# Patient Record
Sex: Male | Born: 1980 | Race: White | Hispanic: No | Marital: Single | Smoking: Never smoker
Health system: Southern US, Community
[De-identification: ages and names within clinical notes are randomized; demographics above are authoritative.]

## PROBLEM LIST (undated history)

## (undated) DIAGNOSIS — N200 Calculus of kidney: Secondary | ICD-10-CM

## (undated) HISTORY — PX: LITHOTRIPSY: SUR834

---
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
## 2003-01-07 ENCOUNTER — Encounter: Admission: RE | Admit: 2003-01-07 | Discharge: 2003-01-07 | Payer: Self-pay | Admitting: Family Medicine

## 2003-01-07 ENCOUNTER — Emergency Department (HOSPITAL_COMMUNITY): Admission: EM | Admit: 2003-01-07 | Discharge: 2003-01-07 | Payer: Self-pay | Admitting: Emergency Medicine

## 2003-01-07 ENCOUNTER — Encounter: Payer: Self-pay | Admitting: Family Medicine

## 2003-01-13 ENCOUNTER — Ambulatory Visit (HOSPITAL_BASED_OUTPATIENT_CLINIC_OR_DEPARTMENT_OTHER): Admission: RE | Admit: 2003-01-13 | Discharge: 2003-01-13 | Payer: Self-pay | Admitting: Urology

## 2003-01-13 ENCOUNTER — Encounter: Payer: Self-pay | Admitting: Urology

## 2005-03-08 ENCOUNTER — Encounter: Admission: RE | Admit: 2005-03-08 | Discharge: 2005-03-08 | Payer: Self-pay | Admitting: Family Medicine

## 2007-01-13 ENCOUNTER — Emergency Department (HOSPITAL_COMMUNITY): Admission: EM | Admit: 2007-01-13 | Discharge: 2007-01-13 | Payer: Self-pay | Admitting: Family Medicine

## 2008-06-16 ENCOUNTER — Ambulatory Visit (HOSPITAL_COMMUNITY): Admission: RE | Admit: 2008-06-16 | Discharge: 2008-06-16 | Payer: Self-pay | Admitting: Urology

## 2008-07-30 ENCOUNTER — Emergency Department (HOSPITAL_COMMUNITY): Admission: EM | Admit: 2008-07-30 | Discharge: 2008-07-30 | Payer: Self-pay | Admitting: Emergency Medicine

## 2008-09-27 ENCOUNTER — Emergency Department (HOSPITAL_COMMUNITY): Admission: EM | Admit: 2008-09-27 | Discharge: 2008-09-27 | Payer: Self-pay | Admitting: Emergency Medicine

## 2010-08-23 ENCOUNTER — Encounter: Payer: Self-pay | Admitting: Internal Medicine

## 2010-08-23 ENCOUNTER — Ambulatory Visit: Payer: Self-pay | Admitting: Internal Medicine

## 2010-08-23 DIAGNOSIS — R5381 Other malaise: Secondary | ICD-10-CM | POA: Insufficient documentation

## 2010-08-23 DIAGNOSIS — G4700 Insomnia, unspecified: Secondary | ICD-10-CM | POA: Insufficient documentation

## 2010-08-23 DIAGNOSIS — R5383 Other fatigue: Secondary | ICD-10-CM

## 2010-08-23 DIAGNOSIS — E785 Hyperlipidemia, unspecified: Secondary | ICD-10-CM

## 2010-08-23 DIAGNOSIS — Z87442 Personal history of urinary calculi: Secondary | ICD-10-CM | POA: Insufficient documentation

## 2010-08-23 LAB — CONVERTED CEMR LAB
ALT: 66 units/L — ABNORMAL HIGH (ref 0–53)
AST: 28 units/L (ref 0–37)
Alkaline Phosphatase: 63 units/L (ref 39–117)
Basophils Absolute: 0 10*3/uL (ref 0.0–0.1)
CO2: 29 meq/L (ref 19–32)
Chloride: 103 meq/L (ref 96–112)
Cholesterol: 211 mg/dL — ABNORMAL HIGH (ref 0–200)
GFR calc non Af Amer: 92.8 mL/min (ref >60)
Glucose, Bld: 90 mg/dL (ref 70–99)
HCT: 48.5 % (ref 39.0–52.0)
HDL: 38.5 mg/dL — ABNORMAL LOW (ref >39.00)
Hemoglobin, Urine: NEGATIVE
LDL Goal: 160 mg/dL
Leukocytes, UA: NEGATIVE
Lymphs Abs: 2.6 10*3/uL (ref 0.7–4.0)
MCHC: 34.8 g/dL (ref 30.0–36.0)
MCV: 88.6 fL (ref 78.0–100.0)
Neutrophils Relative %: 49.5 % (ref 43.0–77.0)
Platelets: 309 10*3/uL (ref 150.0–400.0)
Potassium: 5 meq/L (ref 3.5–5.1)
Sodium: 139 meq/L (ref 135–145)
Total Bilirubin: 0.9 mg/dL (ref 0.3–1.2)
Urine Glucose: NEGATIVE mg/dL
Urobilinogen, UA: 0.2 (ref 0.0–1.0)
VLDL: 38 mg/dL (ref 0.0–40.0)

## 2010-08-24 ENCOUNTER — Encounter: Payer: Self-pay | Admitting: Internal Medicine

## 2010-10-25 ENCOUNTER — Ambulatory Visit: Payer: Self-pay | Admitting: Internal Medicine

## 2010-11-08 ENCOUNTER — Emergency Department (HOSPITAL_COMMUNITY)
Admission: EM | Admit: 2010-11-08 | Discharge: 2010-11-08 | Payer: Self-pay | Source: Home / Self Care | Admitting: Emergency Medicine

## 2010-12-09 ENCOUNTER — Encounter: Payer: Self-pay | Admitting: Family Medicine

## 2010-12-18 NOTE — Letter (Signed)
Summary: Results Follow-up Letter  [facility]  [address] [place], [place] [address]   Phone: [phone]  Fax: [phone]    08/24/2010  [address] [name], [place]  [address]  Dear [name],   The following are the results of your recent test(s):  Test     Result     Liver enzymes   one was very slightly elevated Kidney     normal CBC       normal Thyroid     normal Urine       normal STD tests     negative   _________________________________________________________  Please call for an appointment in 1-2 months to recheck liver enzymes _________________________________________________________ _________________________________________________________ _________________________________________________________  Sincerely,  Sanda Linger MD [facility]

## 2010-12-18 NOTE — Letter (Signed)
Summary: Lipid Letter  [facility]  [address] [place], [place] [address]   Phone: [phone]  Fax: [phone]    08/23/2010  Raiford Fetterman [address] [place], [place]  [address]  Dear Tamera Reason:  We have carefully reviewed your last lipid profile from  and the results are noted below with a summary of recommendations for lipid management.    Cholesterol:       211     Goal: <200   HDL "good" Cholesterol:   41.32     Goal: >40   LDL "bad" Cholesterol:   140     Goal: <160   Triglycerides:       190.0     Goal: <150        TLC Diet (Therapeutic Lifestyle Change): Saturated Fats & Transfatty acids should be kept < 7% of total calories ***Reduce Saturated Fats Polyunstaurated Fat can be up to 10% of total calories Monounsaturated Fat Fat can be up to 20% of total calories Total Fat should be no greater than 25-35% of total calories Carbohydrates should be 50-60% of total calories Protein should be approximately 15% of total calories Fiber should be at least 20-30 grams a day ***Increased fiber may help lower LDL Total Cholesterol should be < 200mg /day Consider adding plant stanol/sterols to diet (example: Benacol spread) ***A higher intake of unsaturated fat may reduce Triglycerides and Increase HDL    Adjunctive Measures (may lower LIPIDS and reduce risk of Heart Attack) include: Aerobic Exercise (20-30 minutes 3-4 times a week) Limit Alcohol Consumption Weight Reduction Aspirin 75-81 mg a day by mouth (if not allergic or contraindicated) Dietary Fiber 20-30 grams a day by mouth     Current Medications: 1)    Zolpidem Tartrate 10 Mg Tabs (Zolpidem tartrate) .... One by mouth at bedtime as needed for insomnia  If you have any questions, please call. We appreciate being able to work with you.   Sincerely,    [facility] [name] MD

## 2010-12-18 NOTE — Assessment & Plan Note (Signed)
Summary: New Cpx    Vital Signs:  Patient profile:   30 year old male Height:      68 inches Weight:      181 pounds BMI:     27.62 O2 Sat:      98 % on Room air Temp:     97.8 degrees F oral Pulse rate:   69 / minute Pulse rhythm:   regular Resp:     16 per minute BP sitting:   118 / 88  (left arm) Cuff size:   large  Vitals Entered By: Adam Drake CMA (August 23, 2010 10:16 AM)  Nutrition Counseling: Patient's BMI is greater than 25 and therefore counseled on weight management options.  O2 Flow:  Room air CC: New pt CPX w/ labs, Insomnia, Fatigue, Lipid Management Is Patient Diabetic? No Pain Assessment Patient in pain? no       Does patient need assistance? Functional Status Self care Ambulation Normal   Primary Care Adam Drake:  Adam Grandchild MD  CC:  New pt CPX w/ labs, Insomnia, Fatigue, and Lipid Management.  History of Present Illness:  Insomnia      [name] who presents with Insomnia.  The symptoms began 4-6 months ago.  The severity is described as mild.  The patient reports difficulty falling asleep, frequent awakening, and daytime somnolence, but denies early awakening, nightmares, leg movements, snoring, and apnea noted by partner.  Associated symptoms include weight gain.  Risk factors for insomnia include irregular work schedule.    Fatigue      The patient also presents with Fatigue.  The symptoms began 4-8 weeks ago.  The severity is described as mild.  The patient reports persistent fatigue and primarily motivational fatigue, but denies fatigue with vigorous exertion, fatigue with moderate exertion, fatigue with minimal exertion, fatigue without physical limitations, and primarily physical fatigue.  The patient denies fever, night sweats, weight loss, exertional chest pain, dyspnea, cough, hemoptysis, and new medications.  Other symptoms include daytime sleepiness.  The patient denies the following symptoms: leg swelling, orthopnea,  PND, melena, adenopathy, severe snoring, and skin changes.  Depressive symptoms include anhedonia and poor sleep.  The patient denies feeling depressed and altered appetite.    Also, he wants to be screened for STD's. He was monogamous with one girlfriend for 5 years and he has not had any signs or symptoms but he wants to be checked. Neither he nor his GF have ever had an STD.  Lipid Management History:      Negative NCEP/ATP III risk factors include male age less than 67 years old, non-diabetic, no family history for ischemic heart disease, non-tobacco-user status, non-hypertensive, no ASHD (atherosclerotic heart disease), no prior stroke/TIA, no peripheral vascular disease, and no history of aortic aneurysm.        The patient states that he knows about the "Therapeutic Lifestyle Change" diet.  His compliance with the TLC diet is good.  Adjunctive measures started by the patient include aerobic exercise, fiber, limit alcohol consumpton, and weight reduction.     Preventive Screening-Counseling & Management  Alcohol-Tobacco     Alcohol drinks/day: <1     Alcohol type: all     >5/day in last 3 mos: no     Alcohol Counseling: not indicated; use of alcohol is not excessive or problematic     Feels need to cut down: no     Feels annoyed by complaints: no  Feels guilty re: drinking: no     Needs 'eye opener' in am: no     Smoking Status: never     Tobacco Counseling: not indicated; no tobacco use  Caffeine-Diet-Exercise     Does Patient Exercise: yes  Hep-HIV-STD-Contraception     Hepatitis Risk: no risk noted     HIV Risk: no risk noted     STD Risk: no risk noted     Dental Visit-last 6 months yes     Dental Care Counseling: to seek dental care; no dental care within six months     TSE monthly: yes     Testicular SE Education/Counseling to perform regular STE     Sun Exposure-Excessive: no  Safety-Violence-Falls     Seat Belt Use: yes     Helmet Use: yes     Firearms in the  Home: firearms in the home     Firearm Counseling: to practice firearm safety     Smoke Detectors: yes     Violence in the Home: no risk noted     Sexual Abuse: no      Sexual History:  currently monogamous.        Drug Use:  never and no.        Blood Transfusions:  no.    Current Medications (verified): 1)  None  Allergies (verified): 1)  ! Codeine  Past History:  Past Medical History: Hyperlipidemia Nephrolithiasis, hx of  Past Surgical History: Denies surgical history  Family History: Family History High cholesterol Family History Hypertension Family History Diabetes Family History Breast Cancer  Social History: Occupation: Emergency planning/management officer Single Never Smoked Alcohol use-yes Drug use-no Regular exercise-yes Smoking Status:  never Drug Use:  never, no Does Patient Exercise:  yes Education:  Environmental manager Use:  yes Hepatitis Risk:  no risk noted HIV Risk:  no risk noted STD Risk:  no risk noted Dental Care w/in 6 mos.:  yes Sun Exposure-Excessive:  no Sexual History:  currently monogamous Blood Transfusions:  no  Review of Systems       The patient complains of weight gain.  The patient denies anorexia, fever, weight loss, chest pain, syncope, dyspnea on exertion, peripheral edema, prolonged cough, headaches, hemoptysis, abdominal pain, hematochezia, severe indigestion/heartburn, hematuria, suspicious skin lesions, difficulty walking, depression, abnormal bleeding, enlarged lymph nodes, and testicular masses.   General:  Complains of fatigue, malaise, and sleep disorder; denies chills, fever, loss of appetite, sweats, weakness, and weight loss. Resp:  Complains of excessive snoring; denies chest discomfort, chest pain with inspiration, cough, coughing up blood, hypersomnolence, morning headaches, pleuritic, shortness of breath, sputum productive, and wheezing. GU:  Denies decreased libido, discharge, dysuria, erectile dysfunction, genital sores,  hematuria, urinary frequency, and urinary hesitancy.  Physical Exam  General:  alert, well-developed, well-nourished, well-hydrated, appropriate dress, normal appearance, healthy-appearing, cooperative to examination, and good hygiene.   Head:  normocephalic, atraumatic, no abnormalities observed, no abnormalities palpated, and no alopecia.   Eyes:  vision grossly intact, pupils equal, pupils round, and pupils reactive to light.   Ears:  R ear normal and L ear normal.   Mouth:  Oral mucosa and oropharynx without lesions or exudates.  Teeth in good repair. Neck:  supple, full ROM, no masses, no thyromegaly, no thyroid nodules or tenderness, no JVD, normal carotid upstroke, no carotid bruits, no cervical lymphadenopathy, and no neck tenderness.   Breasts:  No masses or gynecomastia noted Lungs:  Normal respiratory effort, chest expands symmetrically. Lungs are  clear to auscultation, no crackles or wheezes. Heart:  Normal rate and regular rhythm. S1 and S2 normal without gallop, murmur, click, rub or other extra sounds. Abdomen:  Bowel sounds positive,abdomen soft and non-tender without masses, organomegaly or hernias noted. Genitalia:  circumcised, no hydrocele, no varicocele, no scrotal masses, no testicular masses or atrophy, no cutaneous lesions, and no urethral discharge.   Msk:  No deformity or scoliosis noted of thoracic or lumbar spine.   Pulses:  R and L carotid,radial,femoral,dorsalis pedis and posterior tibial pulses are full and equal bilaterally Extremities:  No clubbing, cyanosis, edema, or deformity noted with normal full range of motion of all joints.   Neurologic:  No cranial nerve deficits noted. Station and gait are normal. Plantar reflexes are down-going bilaterally. DTRs are symmetrical throughout. Sensory, motor and coordinative functions appear intact. Skin:  Intact without suspicious lesions or rashes Cervical Nodes:  no anterior cervical adenopathy and no posterior cervical  adenopathy.   Axillary Nodes:  no R axillary adenopathy and no L axillary adenopathy.   Inguinal Nodes:  no R inguinal adenopathy and no L inguinal adenopathy.   Psych:  Cognition and judgment appear intact. Alert and cooperative with normal attention span and concentration. No apparent delusions, illusions, hallucinations   Impression & Recommendations:  Problem # 1:  FATIGUE (ICD-780.79) Assessment New I think this is caused by his irregular sleep pattern, I will treat his sleep disorder and look for an improvement in his symptoms. If he does not improve and snoring and other symptoms persist that I will talk to him about a sleep study. Orders: Venipuncture ([address]) TLB-Lipid Panel (80061-LIPID) TLB-BMP (Basic Metabolic Panel-BMET) (80048-METABOL) TLB-CBC Platelet - w/Differential (85025-CBCD) TLB-Hepatic/Liver Function Pnl (80076-HEPATIC) TLB-TSH (Thyroid Stimulating Hormone) (84443-TSH) EKG w/ Interpretation ([address])  Problem # 2:  INSOMNIA-SLEEP DISORDER-UNSPEC (ICD-780.52) Assessment: New as above His updated medication list for this problem includes:    Zolpidem Tartrate 10 Mg Tabs (Zolpidem tartrate) ..... One by mouth at bedtime as needed for insomnia  Problem # 3:  ROUTINE GENERAL MEDICAL EXAM@HEALTH  CARE FACL (ICD-V70.0) Assessment: New  Orders: TLB-Udip w/ Micro (81001-URINE) T-Chlamydia  Probe, urine [id number]) T-GC Probe, urine (91478-29562) EKG w/ Interpretation (93000)  Td Booster: Historical (11/18/2006)   Flu Vax: Historical (11/19/2007)   Pneumovax: Historical (11/18/2006)  Discussed using sunscreen, use of alcohol, drug use, self testicular exam, routine dental care, routine eye care, routine physical exam, seat belts, multiple vitamins, and recommendations for immunizations.  Discussed exercise and checking cholesterol.  Discussed gun safety and safe sex.  Problem # 4:  HYPERLIPIDEMIA (ICD-272.4) Assessment: Unchanged  Lipid Goals: Chol Goal: 200  (08/23/2010)   HDL Goal: 40 (08/23/2010)   LDL Goal: 160 (08/23/2010)   TG Goal: 150 (08/23/2010)  10 Yr Risk Heart Disease: Not enough information  Complete Medication List: 1)  Zolpidem Tartrate 10 Mg Tabs (Zolpidem tartrate) .... One by mouth at bedtime as needed for insomnia  Lipid Assessment/Plan:      Based on NCEP/ATP III, the patient's risk factor category is "0-1 risk factors".  The patient's lipid goals are as follows: Total cholesterol goal is 200; LDL cholesterol goal is 160; HDL cholesterol goal is 40; Triglyceride goal is 150.    Patient Instructions: 1)  Please schedule a follow-up appointment in 2 months. 2)  It is important that you exercise regularly at least 20 minutes 5 times a week. If you develop chest pain, have severe difficulty breathing, or feel very tired , stop exercising immediately and seek  medical attention. 3)  You need to lose weight. Consider a lower calorie diet and regular exercise.  Prescriptions: ZOLPIDEM TARTRATE 10 MG TABS (ZOLPIDEM TARTRATE) One by mouth at bedtime as needed for insomnia  #20 x 3   Entered and Authorized by:   Adam Grandchild MD   Signed by:   Adam Grandchild MD on 08/23/2010   Method used:   Print then Give to Patient   RxID:   [id number]   Preventive Care Screening  Last Tetanus Booster:    Date:  11/18/2006    Results:  Historical     Immunization History:  Influenza Immunization History:    Influenza:  historical (11/19/2007)  Pneumovax Immunization History:    Pneumovax:  historical (11/18/2006)

## 2011-01-28 LAB — URINE CULTURE
Colony Count: NO GROWTH
Culture  Setup Time: 201112230114

## 2011-01-28 LAB — URINE MICROSCOPIC-ADD ON

## 2011-01-28 LAB — URINALYSIS, ROUTINE W REFLEX MICROSCOPIC
Glucose, UA: NEGATIVE mg/dL
Ketones, ur: NEGATIVE mg/dL
Leukocytes, UA: NEGATIVE
Protein, ur: 30 mg/dL — AB

## 2011-06-29 IMAGING — CT CT ABD-PELV W/O CM
1 of 2 series · 15 of 32 positions shown, 19 images · non-contrast
Comparison: 09/27/2008

CLINICAL DATA: Right flank pain.  History of kidney stones

CT ABDOMEN AND PELVIS WITHOUT CONTRAST
TECHNIQUE: Multidetector CT imaging of the abdomen and pelvis was
performed following the standard protocol without intravenous
contrast.

[Series 2: under 200# stone no prev · axial · 0.63mm/px · z∈[-490,-50]mm · 15 of 98 slices shown, 19 images]
[im 5/98  soft-tissue]
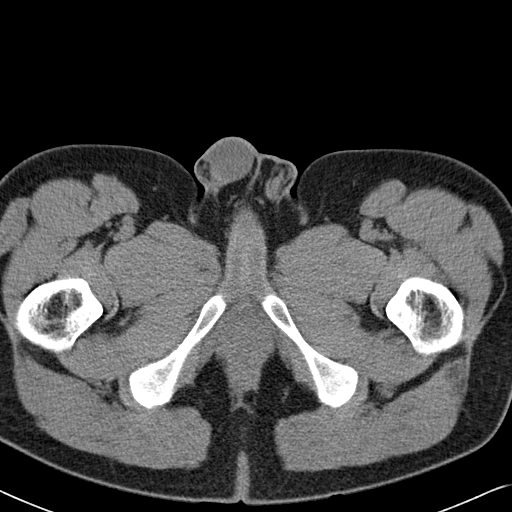
[im 5/98  bone]
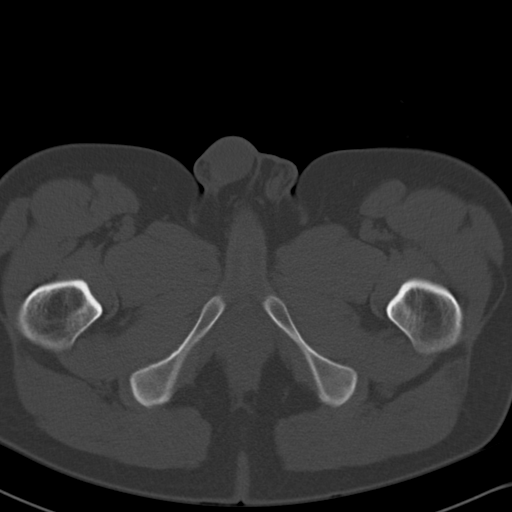
[im 13/98  soft-tissue]
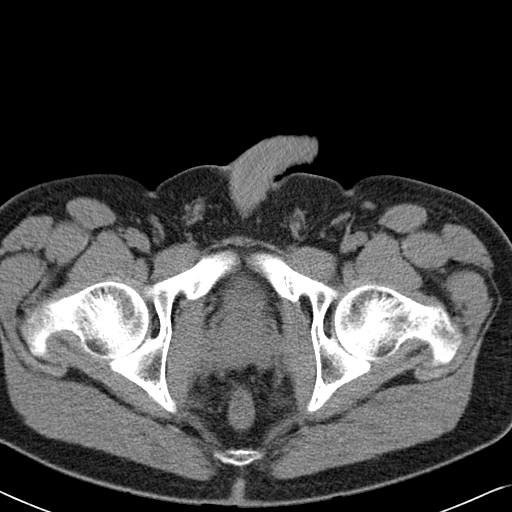
[im 21/98  soft-tissue]
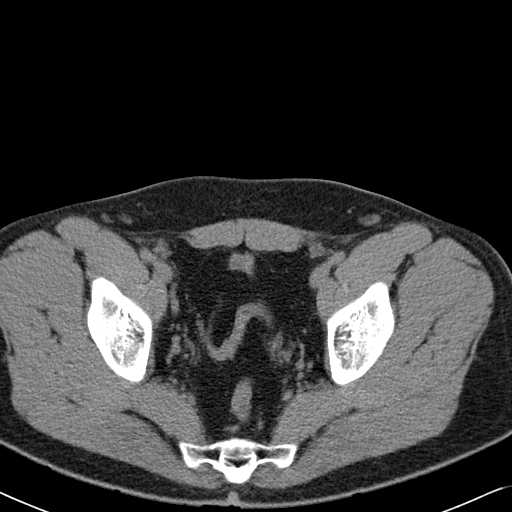
[im 29/98  soft-tissue]
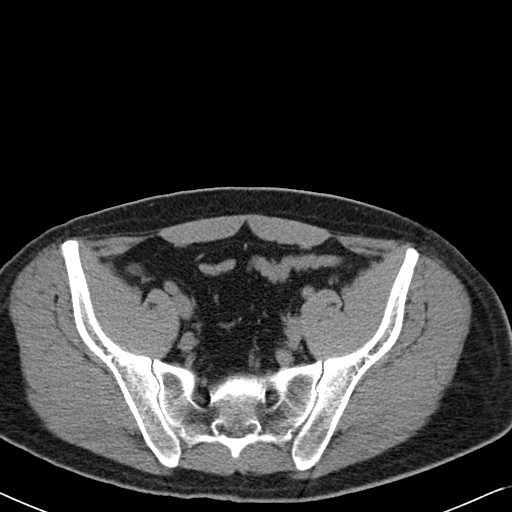
[im 33/98  soft-tissue]
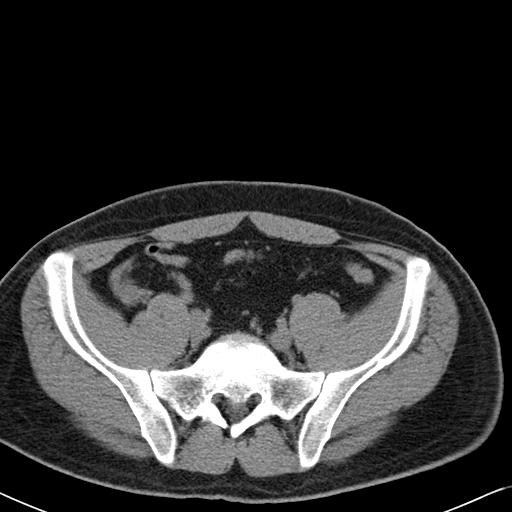
[im 41/98  soft-tissue]
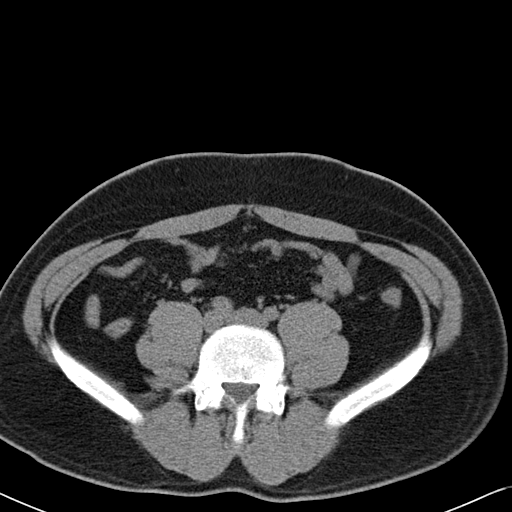
[im 49/98  soft-tissue]
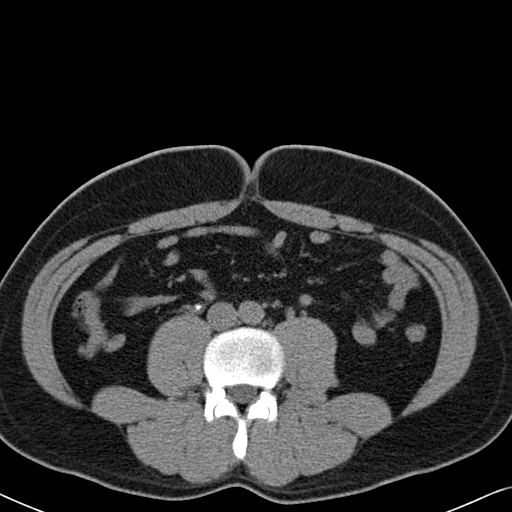
[im 57/98  soft-tissue]
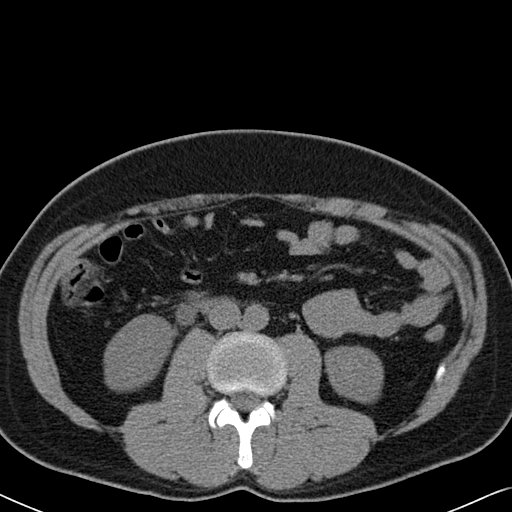
[im 65/98  soft-tissue]
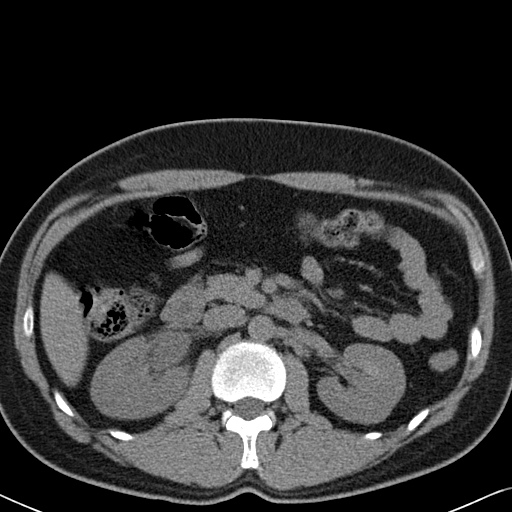
[im 65/98  bone]
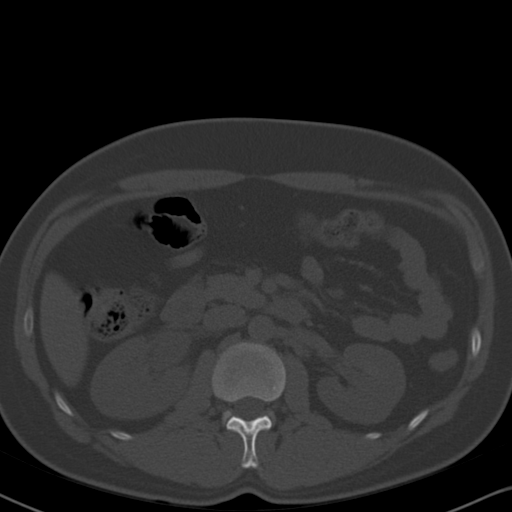
[im 69/98  soft-tissue]
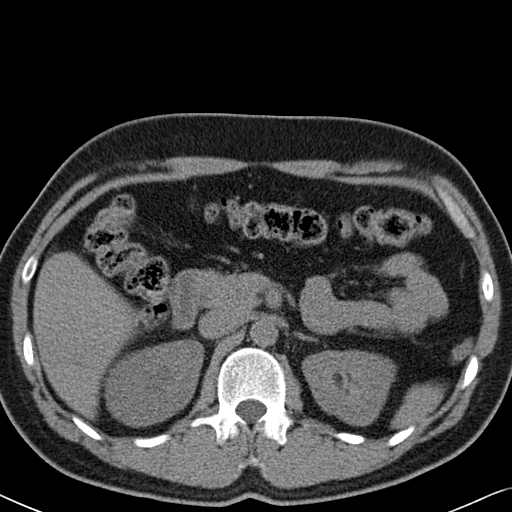
[im 77/98  soft-tissue]
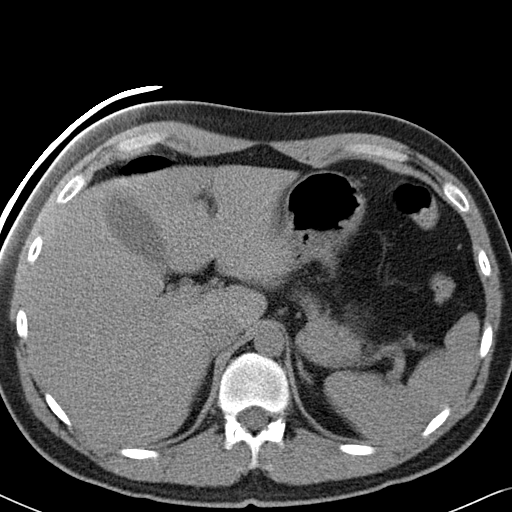
[im 81/98  lung]
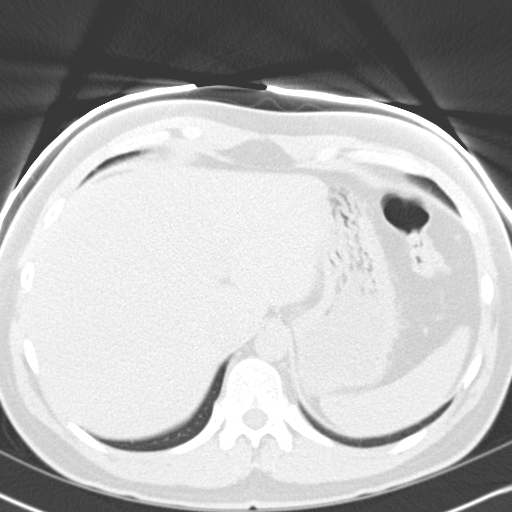
[im 85/98  soft-tissue]
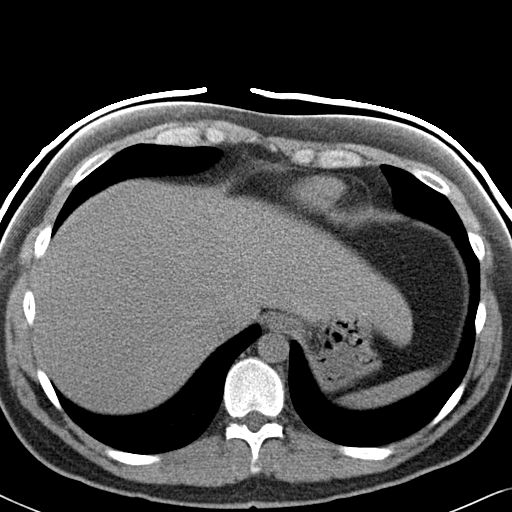
[im 85/98  lung]
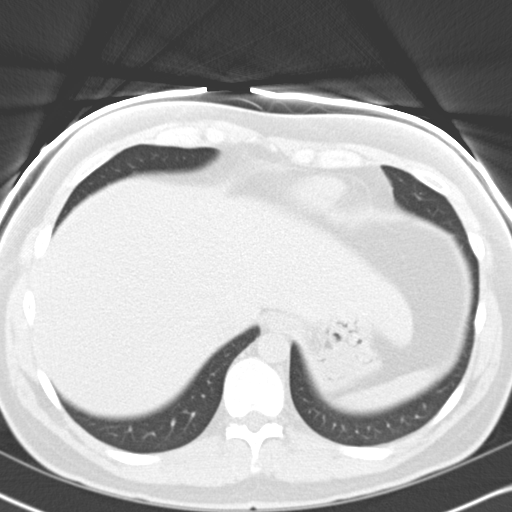
[im 89/98  lung]
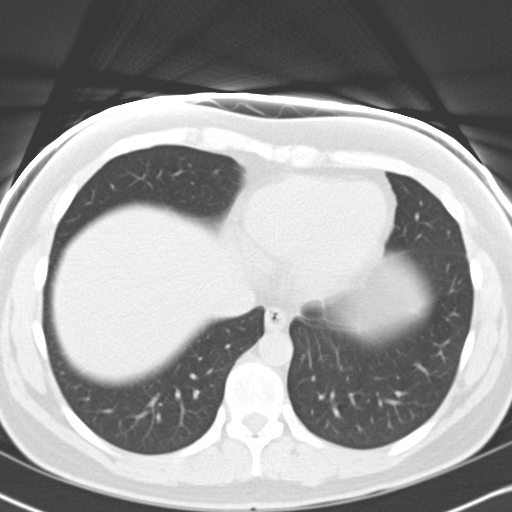
[im 93/98  soft-tissue]
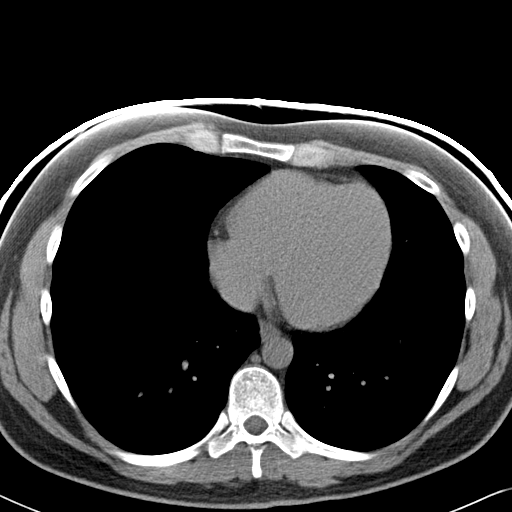
[im 93/98  lung]
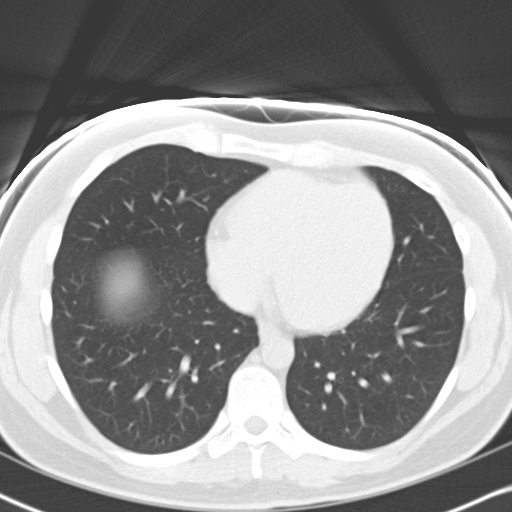

[15 of 32 positions shown; findings below may reference images not displayed]

FINDINGS: The lung bases are clear.  There is  a small
nonobstructing calculus in the lower pole of the left kidney.
There is, however, significant right hydronephrosis and
hydroureter.  On images number 50 and 51 of series 2, there is a 4
x 4 mm calculus.  In the mid ureter, at about the level of the like
this.  It measures 6.6 mm in the CC plane.  No other ureteral or
bladder calculi.

Other abdominal viscera normal in the unenhanced state.  No
calcified gallstones.
IMPRESSION: 1.  4 x 4 x 6.6 mm right mid ureteral calculus with moderate
obstructive uropathy.
2.  There is a small nonobstructing calculus in the lower pole of
the left kidney.
3.  No other acute or significant findings in the unenhanced state.

## 2011-08-16 LAB — CBC
HCT: 47.1
MCV: 88.6
RBC: 5.31
WBC: 8.2

## 2011-08-16 LAB — URINALYSIS, ROUTINE W REFLEX MICROSCOPIC
Bilirubin Urine: NEGATIVE
Glucose, UA: NEGATIVE
Hgb urine dipstick: NEGATIVE
Ketones, ur: NEGATIVE
Protein, ur: NEGATIVE

## 2011-08-16 LAB — URINE MICROSCOPIC-ADD ON

## 2011-08-16 LAB — BASIC METABOLIC PANEL
Chloride: 109
GFR calc Af Amer: >60
Potassium: 4

## 2011-08-20 LAB — URINALYSIS, ROUTINE W REFLEX MICROSCOPIC
Bilirubin Urine: NEGATIVE
Glucose, UA: NEGATIVE
Ketones, ur: NEGATIVE
Leukocytes, UA: NEGATIVE
pH: 6.5

## 2011-08-20 LAB — URINE MICROSCOPIC-ADD ON

## 2011-08-21 LAB — URINALYSIS, ROUTINE W REFLEX MICROSCOPIC
Nitrite: NEGATIVE
Protein, ur: NEGATIVE
Specific Gravity, Urine: >1.03 — ABNORMAL HIGH
Urobilinogen, UA: 0.2

## 2011-08-21 LAB — URINE MICROSCOPIC-ADD ON

## 2013-12-08 ENCOUNTER — Encounter (HOSPITAL_COMMUNITY): Payer: Self-pay | Admitting: Emergency Medicine

## 2013-12-08 ENCOUNTER — Emergency Department (HOSPITAL_COMMUNITY): Payer: BC Managed Care – PPO

## 2013-12-08 ENCOUNTER — Emergency Department (HOSPITAL_COMMUNITY)
Admission: EM | Admit: 2013-12-08 | Discharge: 2013-12-08 | Disposition: A | Discharge status: Home / Self Care | Payer: BC Managed Care – PPO | Attending: Emergency Medicine | Admitting: Emergency Medicine

## 2013-12-08 DIAGNOSIS — R109 Unspecified abdominal pain: Secondary | ICD-10-CM | POA: Insufficient documentation

## 2013-12-08 DIAGNOSIS — Z87442 Personal history of urinary calculi: Secondary | ICD-10-CM | POA: Insufficient documentation

## 2013-12-08 DIAGNOSIS — R110 Nausea: Secondary | ICD-10-CM | POA: Insufficient documentation

## 2013-12-08 HISTORY — DX: Calculus of kidney: N20.0

## 2013-12-08 LAB — URINALYSIS, ROUTINE W REFLEX MICROSCOPIC
Bilirubin Urine: NEGATIVE
Glucose, UA: NEGATIVE mg/dL
Hgb urine dipstick: NEGATIVE
Ketones, ur: NEGATIVE mg/dL
LEUKOCYTES UA: NEGATIVE
Nitrite: NEGATIVE
PROTEIN: NEGATIVE mg/dL
UROBILINOGEN UA: 0.2 mg/dL (ref 0.0–1.0)
pH: 6 (ref 5.0–8.0)

## 2013-12-08 MED ORDER — KETOROLAC TROMETHAMINE 30 MG/ML IJ SOLN
30.0000 mg | Freq: Once | INTRAMUSCULAR | Status: AC
  Administered 2013-12-08: 30 mg via INTRAMUSCULAR
  Filled 2013-12-08: qty 1

## 2013-12-08 MED ORDER — TAMSULOSIN HCL 0.4 MG PO CAPS: 0.4000 mg | ORAL_CAPSULE | Freq: Every day | ORAL | Status: DC

## 2013-12-08 NOTE — ED Notes (Signed)
Patient with no complaints at this time. Respirations even and unlabored. Skin warm/dry. Discharge instructions reviewed with patient at this time. Patient given opportunity to voice concerns/ask questions. Patient discharged at this time and left Emergency Department with steady gait.   

## 2013-12-08 NOTE — ED Notes (Signed)
Pt c/o left flank pain/n/cloudy urine since yesterday.

## 2013-12-08 NOTE — ED Provider Notes (Signed)
CSN: [id number]     Arrival date & time 12/08/13  4098 History  This chart was scribed for Gerhard Munch, MD by Danella Maiers, ED Scribe. This patient was seen in room APA03/APA03 and the patient's care was started at 9:05 AM.    Chief Complaint  Patient presents with  . Flank Pain   The history is provided by the patient. No language interpreter was used.   HPI Comments: Adam Drake is a 33 y.o. male with a h/o kidney stones who presents to the Emergency Department complaining of left flank pain onset yesterday that has gradually worsened since this morning. Pt reports he gets kidney stones every 12-18 months. He reports cold symptoms recently. He also reports nausea. He denies any urinary symptoms. He has not taken anything for the pain. 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   Past Medical History  Diagnosis Date  . Kidney stones    Past Surgical History  Procedure Laterality Date  . Lithotripsy     No family history on file. History  Substance Use Topics  . Smoking status: Never Smoker   . Smokeless tobacco: Not on file  . Alcohol Use: Yes     Comment: occasional    Review of Systems  Constitutional:       Per HPI, otherwise negative  HENT:       Per HPI, otherwise negative  Respiratory:       Per HPI, otherwise negative  Cardiovascular:       Per HPI, otherwise negative  Gastrointestinal: Negative for vomiting.  Endocrine:       Negative aside from HPI  Genitourinary:       Neg aside from HPI   Musculoskeletal:       Per HPI, otherwise negative  Skin: Negative.   Neurological: Negative for syncope.    Allergies  Codeine  Home Medications  No current outpatient prescriptions on file. BP 144/92  Pulse 75  Temp(Src) 98.1 F (36.7 C)  Resp 20  Ht 5\' 8"  (1.727 m)  Wt 165 lb (74.844 kg)  BMI 25.09 kg/m2  SpO2 100% Physical Exam  Nursing note and vitals reviewed. Constitutional: He is oriented to person, place, and time. He appears well-developed. No  distress.  HENT:  Head: Normocephalic and atraumatic.  Eyes: Conjunctivae and EOM are normal.  Cardiovascular: Normal rate and regular rhythm.   Pulmonary/Chest: Effort normal. No stridor. No respiratory distress.  Abdominal: He exhibits no distension.  Musculoskeletal: He exhibits no edema.  Neurological: He is alert and oriented to person, place, and time.  Skin: Skin is warm and dry.  Psychiatric: He has a normal mood and affect.    ED Course  Procedures (including critical care time) Medications  ketorolac (TORADOL) 30 MG/ML injection 30 mg (30 mg Intramuscular Given 12/08/13 1002)    DIAGNOSTIC STUDIES: Oxygen Saturation is 100% on RA, normal by my interpretation.    COORDINATION OF CARE: 9:40 AM- Discussed treatment plan with pt which includes [place] and toradol. Pt agrees to plan.    Labs Review Labs Reviewed  URINALYSIS, ROUTINE W REFLEX MICROSCOPIC - Abnormal; Notable for the following:    Specific Gravity, Urine >1.030 (*)    All other components within normal limits   Imaging Review US Renal  12/08/2013   CLINICAL DATA:  Flank pain  EXAM: RETROPERITONEAL ULTRASOUND  COMPARISON:  CT abdomen and pelvis November 08, 2010  FINDINGS: The right kidney measures 10.8 cm in length. Left kidney measures 11.2 cm  in length. There is no renal mass, pelvicaliectasis, or perinephric fluid collection. There is no sonographically demonstrable calculus or ureterectasis on either side. Renal cortical thickness and echogenicity are normal bilaterally.  No lesion is seen by ultrasound in the region of the urinary bladder. There is calcification in the mid prostate region.  IMPRESSION: There is a prostatic calculus present. Study otherwise unremarkable. In particular, no obstructing focus is identified in either kidney.   Electronically Signed   By: Bretta BangWilliam  Woodruff M.D.   On: 12/08/2013 10:29    EKG Interpretation   None       MDM   1. Flank pain     I personally performed the  services described in this documentation, which was scribed in my presence. The recorded information has been reviewed and is accurate.  This generally well-appearing male presents with flank pain.  Eventually the patient also describes pain associated with likely URI.  Patient's evaluation was largely reassuring, and after a lengthy conversation with him about the findings, including prostate calculi, he was discharged in stable condition to follow up with urology, primary care   Gerhard Munchobert Jalal Rauch, MD 12/08/13 1538

## 2013-12-08 NOTE — Discharge Instructions (Signed)
As discussed, your evaluation here is largely reassuring.  There is some evidence of a prostate stone.  Please discuss this with your urologist.  In terms of your respiratory infection, please use: Sudafed, twice daily Triamcinolone nasal spray, 2 sprays, once daily for 5 days. Mucinex DM, as directed.  It is important that you followup with your primary care physician as well.  Return here for concerning changes in your condition.

## 2014-07-29 IMAGING — US US RENAL
1 series · 14 of 25 positions shown · non-contrast
Comparison: CT abdomen and pelvis November 08, 2010

CLINICAL DATA: Flank pain

EXAM:
RETROPERITONEAL ULTRASOUND

[Series 1: us renal · 0.22mm/px · 14 of 37 slices shown]
[im 1/37]
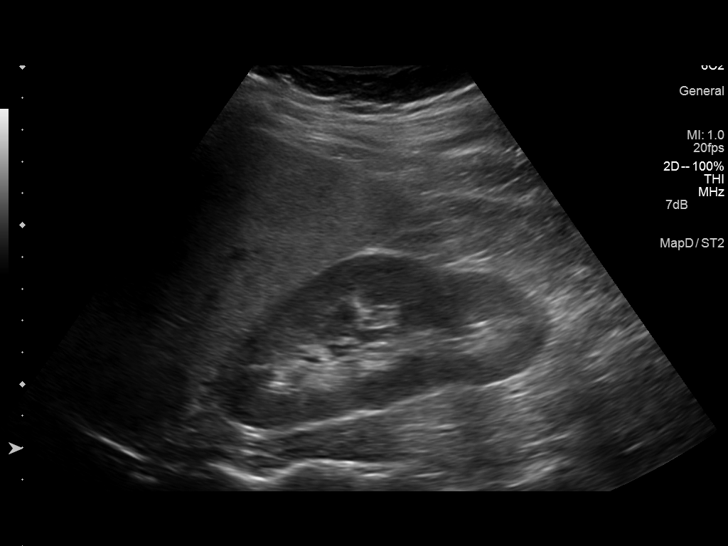
[im 4/37]
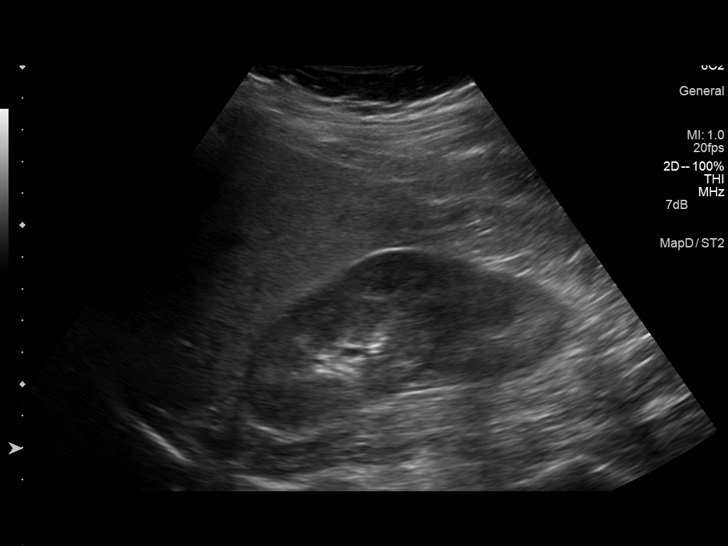
[im 7/37]
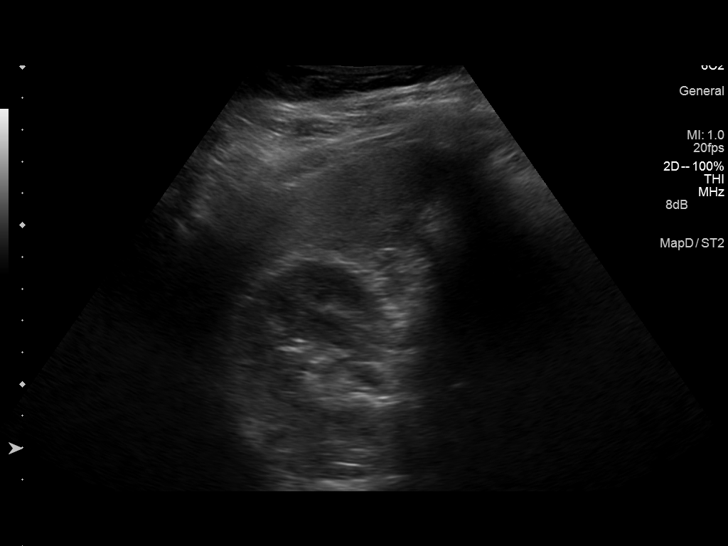
[im 10/37]
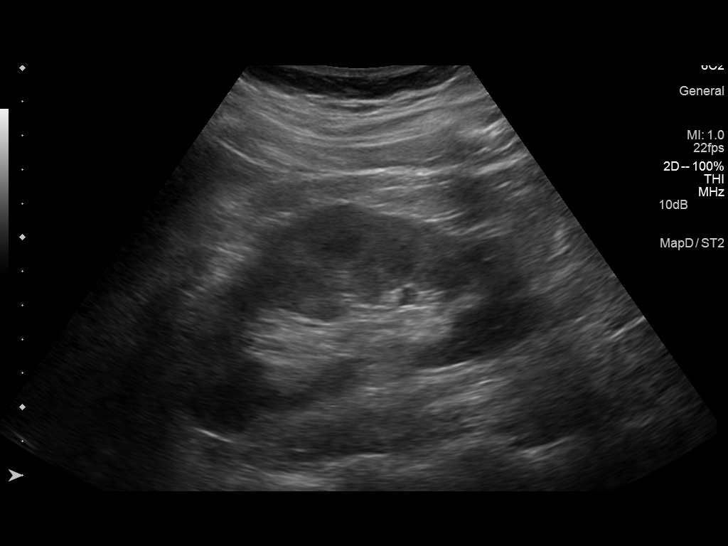
[im 13/37]
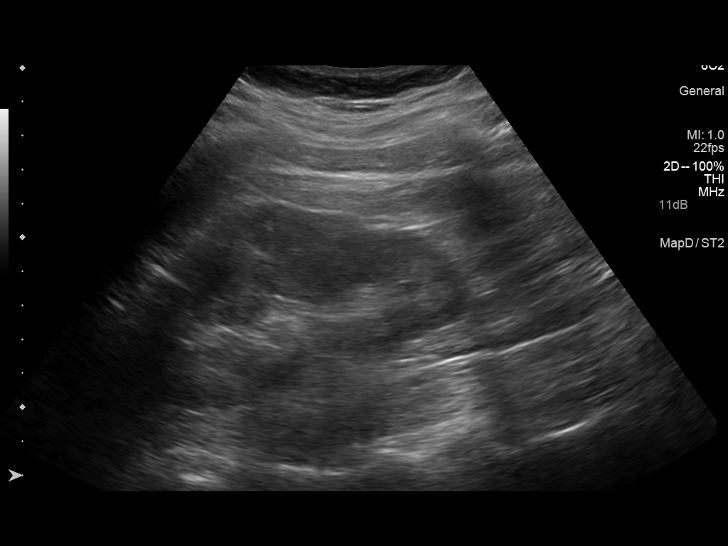
[im 14/37]
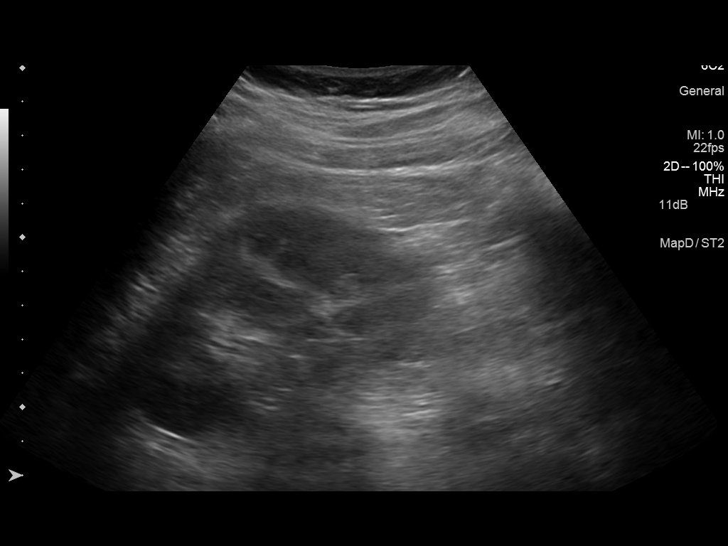
[im 17/37]
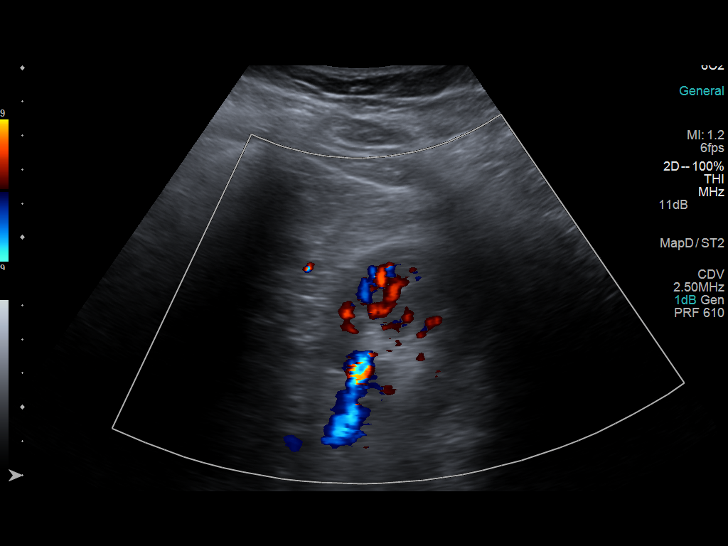
[im 20/37]
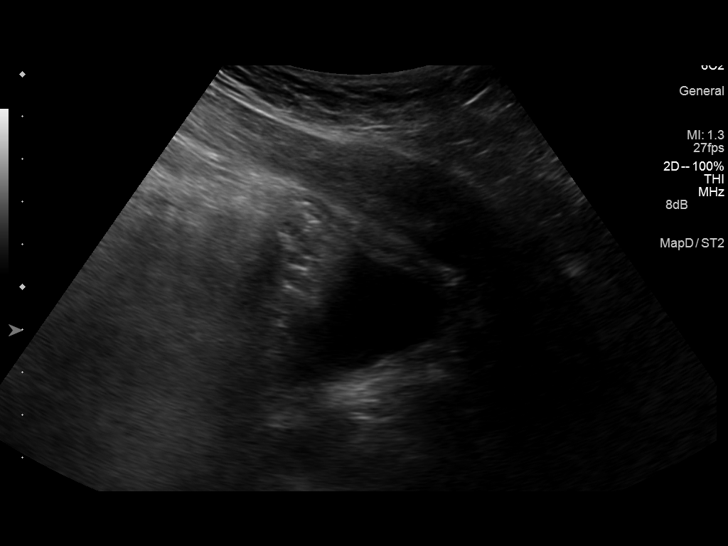
[im 23/37]
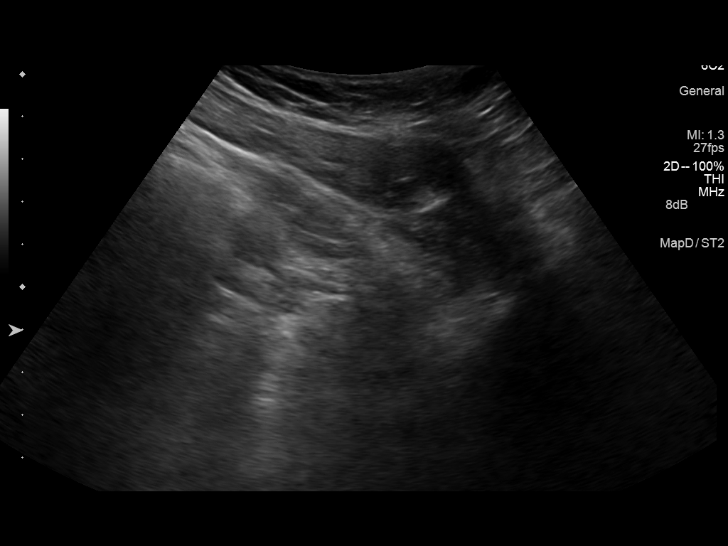
[im 25/37]
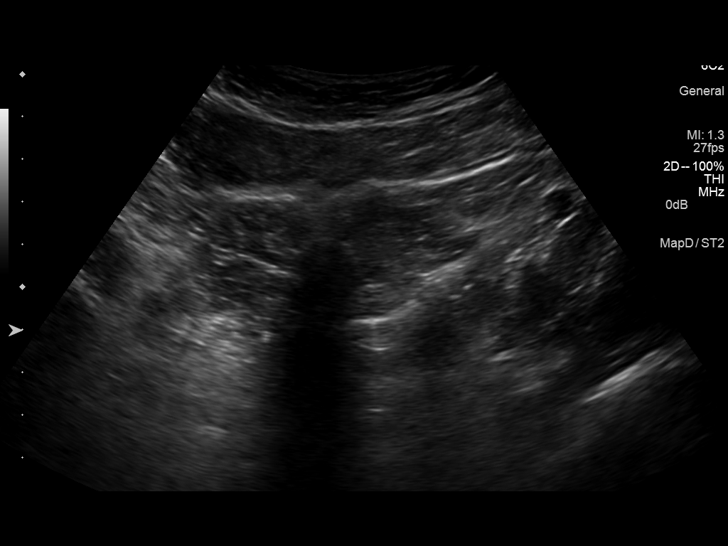
[im 28/37]
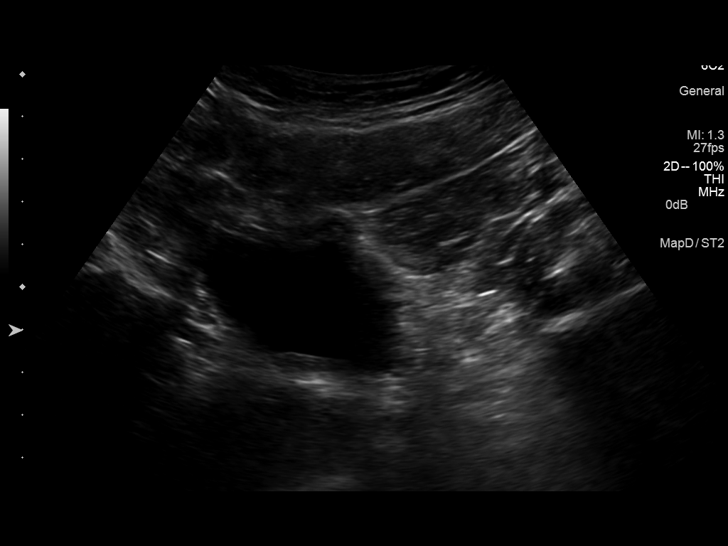
[im 31/37]
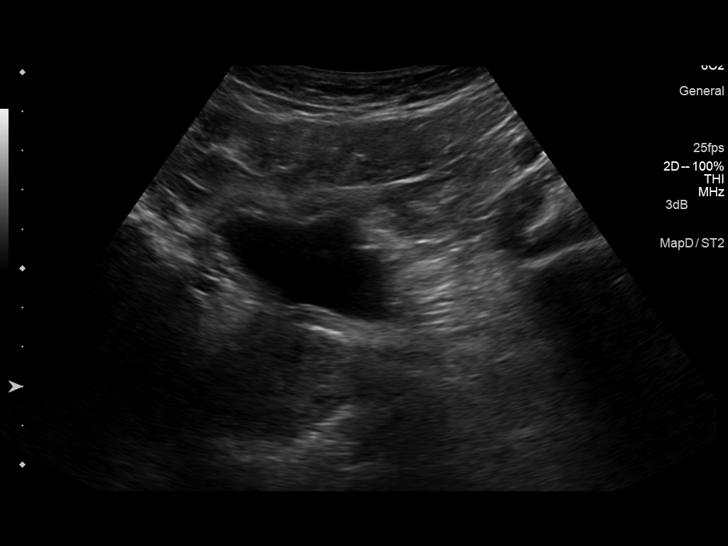
[im 34/37]
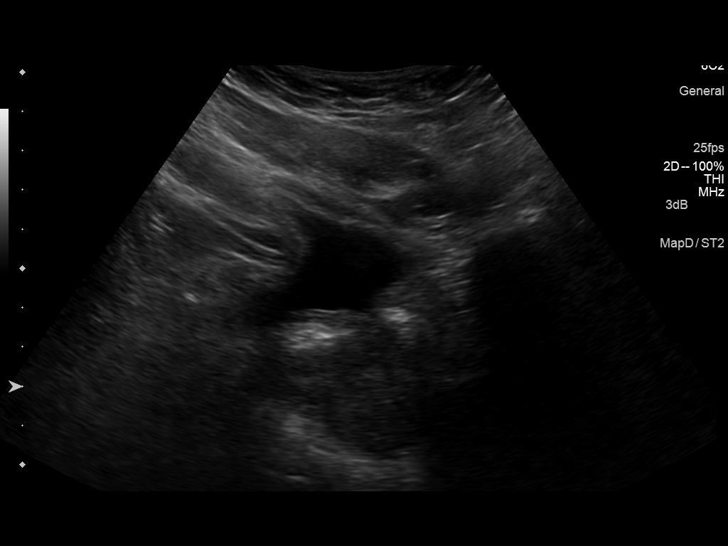
[im 37/37]
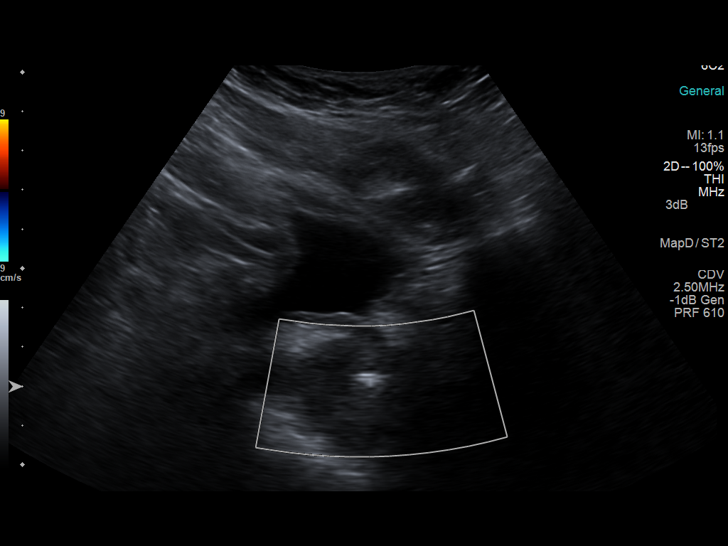

[14 of 25 positions shown; findings below may reference images not displayed]

FINDINGS: The right kidney measures 10.8 cm in length. Left kidney measures
11.2 cm in length. There is no renal mass, pelvicaliectasis, or
perinephric fluid collection. There is no sonographically
demonstrable calculus or ureterectasis on either side. Renal
cortical thickness and echogenicity are normal bilaterally.

No lesion is seen by ultrasound in the region of the urinary
bladder. There is calcification in the mid prostate region.
IMPRESSION: There is a prostatic calculus present. Study otherwise unremarkable.
In particular, no obstructing focus is identified in either kidney.

## 2017-04-29 ENCOUNTER — Encounter: Payer: Self-pay | Admitting: Allergy & Immunology

## 2017-04-29 ENCOUNTER — Encounter (INDEPENDENT_AMBULATORY_CARE_PROVIDER_SITE_OTHER): Payer: Self-pay

## 2017-04-29 ENCOUNTER — Ambulatory Visit (INDEPENDENT_AMBULATORY_CARE_PROVIDER_SITE_OTHER): Payer: PRIVATE HEALTH INSURANCE | Admitting: Allergy & Immunology

## 2017-04-29 VITALS — BP 140/90 | HR 77 | Temp 98.3°F | Resp 17 | Ht 68.7 in | Wt 202.4 lb

## 2017-04-29 DIAGNOSIS — J301 Allergic rhinitis due to pollen: Secondary | ICD-10-CM | POA: Insufficient documentation

## 2017-04-29 MED ORDER — EPINEPHRINE 0.3 MG/0.3ML IJ SOAJ: 0.3000 mg | Freq: Once | INTRAMUSCULAR | 1 refills | Status: AC

## 2017-04-29 NOTE — Progress Notes (Signed)
NEW PATIENT  Date of Service/Encounter:  04/29/17  Referring provider: Dolan Amen, FNP   Assessment:   Non-seasonal allergic rhinitis (trees, weeds, grasses, molds, dog)  Plan/Recommendations:   1. Chronic allergic rhinitis (trees, weeds, grasses, molds, dog) - Testing today showed: trees, weeds, grasses, molds and dog - Avoidance measures provided. - Continue with Allegra (fexofenadine) 180mg  table once daily - Start Singulair (montelukast) 10mg  daily, Dymista (fluticasone/azelastine) two sprays per nostril 1-2 times daily as needed and Pazeo (olopatadine) one drop per eye daily as needed - Mr. Dubuc will call us back if he likes the medications before we send in prescriptions for them.  - We did provide Xyzal samples to see if this works any better than the Allegra (Zyrtec and Claritin have not worked in the past). - You can use an extra dose of the antihistamine, if needed, for breakthrough symptoms.  - Consider allergy shots as a means of long-term control. - Allergy shots "re-train" the immune system to ignore environmental allergens and decrease the resulting immune response to those allergens.  - We can discuss more at the next appointment if the medications are not working for you.  2. Return in about 3 months (around 07/30/2017).    Subjective:   Adam Drake is a 36 y.o. male presenting today for evaluation of  Chief Complaint  Patient presents with  . Nasal Congestion  . Sore Throat    Adam Drake has a history of the following: Patient Active Problem List   Diagnosis Date Noted  . Non-seasonal allergic rhinitis due to pollen 04/29/2017  . HYPERLIPIDEMIA 08/23/2010  . INSOMNIA-SLEEP DISORDER-UNSPEC 08/23/2010  . FATIGUE 08/23/2010  . NEPHROLITHIASIS, HX OF 08/23/2010    History obtained from: chart review and patient.  Adam Drake was referred by Dolan Amen, FNP.     Adam Drake is a 36 y.o. male presenting for an allergy  evaluation. He has had allergies since he was in his early 18s. These have gotten worse has he got older. He endorses nasal congestion, sore throat, and runny nose. He does have eye puffiness. Symptoms improve with antihistamines. He has been using Allegra which takes off most of the symptoms, but he will continue to have sneezing and coughing. Mowing makes this particularly terrible. He is miserable without the medications. He was on multiple nasal steroids (Flonase, Nasonex) without improvement. Flonase worked well for a number of years but then his symptoms became quite recalcitrant. Zyrtec will not touch his symptoms and Claritin does not work either. He has never been on Singulair. He has never been allergy tested. Symptoms resolve most of the way during the winter. Early spring through the end of September is the worst.   He gets sinus infections around 2-3 times per year. Antibiotics do help. Otherwise he has no infectious history. Otherwise, there is no history of other atopic diseases, including asthma, drug allergies, food allergies, stinging insect allergies, or urticaria. Vaccinations are up to date.    Past Medical History: Patient Active Problem List   Diagnosis Date Noted  . Non-seasonal allergic rhinitis due to pollen 04/29/2017  . HYPERLIPIDEMIA 08/23/2010  . INSOMNIA-SLEEP DISORDER-UNSPEC 08/23/2010  . FATIGUE 08/23/2010  . NEPHROLITHIASIS, HX OF 08/23/2010    Medication List:  Allergies as of 04/29/2017      Reactions   Hydrocodone Anaphylaxis   Codeine Swelling   Throat swells REACTION: Anaphylaxis   Oxycodone Other (See Comments)      Medication List  Accurate as of 04/29/17 11:44 AM. Always use your most recent med list.          2-3CC SYRINGE 3 ML Misc by Does not apply route.   citalopram 40 MG tablet Commonly known as:  CELEXA TAKE 1 TABLET BY MOUTH DAILY   EPINEPHrine 0.3 mg/0.3 mL Soaj injection Commonly known as:  EPIPEN 2-PAK Inject 0.3 mLs  (0.3 mg total) into the muscle once.   fluticasone 50 MCG/ACT nasal spray Commonly known as:  FLONASE Place 2 sprays into both nostrils daily as needed.   HYPODERMIC NEEDLE 21GX1-1/2" 21G X 1-1/2" Misc by Does not apply route.   omeprazole 20 MG capsule Commonly known as:  PRILOSEC Take 20 mg by mouth daily.   testosterone cypionate 200 MG/ML injection Commonly known as:  DEPOTESTOSTERONE CYPIONATE Inject into the muscle.   zolpidem 10 MG tablet Commonly known as:  AMBIEN Take 10 mg by mouth at bedtime.       Birth History: non-contributory. Born at term without complications.   Developmental History: non-contributory.   Past Surgical History: Past Surgical History:  Procedure Laterality Date  . LITHOTRIPSY       Family History: Family History  Problem Relation Age of Onset  . Allergic rhinitis Neg Hx   . Angioedema Neg Hx   . Asthma Neg Hx   . Atopy Neg Hx   . Immunodeficiency Neg Hx   . Urticaria Neg Hx   . Eczema Neg Hx      Social History: Tamera ReasonBrenton lives at home by himself. He lives in a 34yo home. There are hardwoods throughout the home. There is one dog in the home. There are dust mite coverings on the bed but the pillows. There is no tobacco exposure in the home. 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 +      Review of Systems: a 14-point review of systems is pertinent for what is mentioned in HPI.  Otherwise, all other systems were negative. Constitutional: negative other than that listed in the HPI Eyes: negative other than that listed in the HPI Ears, nose, mouth, throat, and face: negative other than that listed in the HPI Respiratory: negative other than that listed in the HPI Cardiovascular: negative other than that listed in the HPI Gastrointestinal: negative other than that listed in the HPI Genitourinary: negative other than that listed in the HPI Integument: negative other than that listed in the HPI Hematologic:  negative other than that listed in the HPI Musculoskeletal: negative other than that listed in the HPI Neurological: negative other than that listed in the HPI Allergy/Immunologic: negative other than that listed in the HPI    Objective:   Blood pressure 140/90, pulse 77, temperature 98.3 F (36.8 C), temperature source Oral, resp. rate 17, height 5' 8.7" (1.745 m), weight 202 lb 6.4 oz (91.8 kg), SpO2 98 %. Body mass index is 30.15 kg/m.   Physical Exam:  General: Alert, interactive, in no acute distress. Pleasant male. Adenoidal facies.  Eyes: Conjunctival injection on the right with limbal sparing, Conjunctival injection on the left with limbal sparing, PERRL bilaterally, No discharge on the right, No discharge on the left, No Horner-Trantas dots present and allergic shiners present bilaterally Ears: Right TM pearly gray with normal light reflex, Left TM pearly gray with normal light reflex, Right TM intact without perforation and Left TM intact without perforation.  Nose/Throat: External nose within normal limits, nasal crease present and septum midline, turbinates  markedly edematous and pale with clear discharge, post-pharynx erythematous with cobblestoning in the posterior oropharynx. Tonsils 2+ without exudates Neck: Supple without thyromegaly.  Adenopathy: no enlarged lymph nodes appreciated in the anterior cervical, occipital, axillary, epitrochlear, inguinal, or popliteal regions Lungs: Clear to auscultation without wheezing, rhonchi or rales. No increased work of breathing. CV: Normal S1/S2, no murmurs. Capillary refill <2 seconds.  Abdomen: Nondistended, nontender. No guarding or rebound tenderness. Bowel sounds present in all fields and hypoactive  Skin: Warm and dry, without lesions or rashes. Extremities:  No clubbing, cyanosis or edema. Neuro:   Grossly intact. No focal deficits appreciated. Responsive to questions.  Diagnostic studies:   Allergy  Studies:  Indoor/Outdoor Percutaneous Adult Environmental Panel: positive to bahia grass, johnson grass, [place] blue grass, meadow fescue grass, perennial rye grass, sweet vernal grass, timothy grass and English plantain. Otherwise negative with adequate controls.  Indoor/Outdoor Selected Intradermal Environmental Panel: positive to tree mix, mold mix #2, mold mix #4 and dog. Otherwise negative with adequate controls.       Malachi Bonds, MD FAAAAI [facility]

## 2017-04-29 NOTE — Patient Instructions (Addendum)
1. Chronic rhinitis - Testing today showed: trees, weeds, grasses, molds and dog - Avoidance measures provided. - Continue with Allegra (fexofenadine) 180mg  table once daily - Start Singulair (montelukast) 10mg  daily, Dymista (fluticasone/azelastine) two sprays per nostril 1-2 times daily as needed and Pazeo (olopatadine) one drop per eye daily as needed - You can use an extra dose of the antihistamine, if needed, for breakthrough symptoms.  - Consider allergy shots as a means of long-term control. - Allergy shots "re-train" the immune system to ignore environmental allergens and decrease the resulting immune response to those allergens.  - We can discuss more at the next appointment if the medications are not working for you.  2. Return in about 3 months (around 07/30/2017).  Please inform us of any Emergency Department visits, hospitalizations, or changes in symptoms. Call us before going to the ED for breathing or allergy symptoms since we might be able to fit you in for a sick visit. Feel free to contact us anytime with any questions, problems, or concerns.  It was a pleasure to meet you today! Happy summer!   Websites that have reliable patient information: 1. American Academy of Asthma, Allergy, and Immunology: www.aaaai.org 2. Food Allergy Research and Education (FARE): foodallergy.org 3. Mothers of Asthmatics: http://www.asthmacommunitynetwork.org 4. American College of Allergy, Asthma, and Immunology: www.acaai.org  Reducing Pollen Exposure  The American Academy of Allergy, Asthma and Immunology suggests the following steps to reduce your exposure to pollen during allergy seasons.    1. Do not hang sheets or clothing out to dry; pollen may collect on these items. 2. Do not mow lawns or spend time around freshly cut grass; mowing stirs up pollen. 3. Keep windows closed at night.  Keep car windows closed while driving. 4. Minimize morning activities outdoors, a time when pollen  counts are usually at their highest. 5. Stay indoors as much as possible when pollen counts or humidity is high and on windy days when pollen tends to remain in the air longer. 6. Use air conditioning when possible.  Many air conditioners have filters that trap the pollen spores. 7. Use a HEPA room air filter to remove pollen form the indoor air you breathe.  Control of Mold Allergen  Mold and fungi can grow on a variety of surfaces provided certain temperature and moisture conditions exist.  Outdoor molds grow on plants, decaying vegetation and soil.  The major outdoor mold, Alternaria and Cladosporium, are found in very high numbers during hot and dry conditions.  Generally, a late Summer - Fall peak is seen for common outdoor fungal spores.  Rain will temporarily lower outdoor mold spore count, but counts rise rapidly when the rainy period ends.  The most important indoor molds are Aspergillus and Penicillium.  Dark, humid and poorly ventilated basements are ideal sites for mold growth.  The next most common sites of mold growth are the bathroom and the kitchen.  Outdoor MicrosoftMold Control 1. Use air conditioning and keep windows closed 2. Avoid exposure to decaying vegetation. 3. Avoid leaf raking. 4. Avoid grain handling. 5. Consider wearing a face mask if working in moldy areas.  Indoor Mold Control 1. Maintain humidity below 50%. 2. Clean washable surfaces with 5% bleach solution. 3. Remove sources e.g. contaminated carpets.  Control of Dog or Cat Allergen  Avoidance is the best way to manage a dog or cat allergy. If you have a dog or cat and are allergic to dog or cats, consider removing the dog or cat  from the home. If you have a dog or cat but don't want to find it a new home, or if your family wants a pet even though someone in the household is allergic, here are some strategies that may help keep symptoms at bay:  1. Keep the pet out of your bedroom and restrict it to only a few  rooms. Be advised that keeping the dog or cat in only one room will not limit the allergens to that room. 2. Don't pet, hug or kiss the dog or cat; if you do, wash your hands with soap and water. 3. High-efficiency particulate air (HEPA) cleaners run continuously in a bedroom or living room can reduce allergen levels over time. 4. Regular use of a high-efficiency vacuum cleaner or a central vacuum can reduce allergen levels. 5. Giving your dog or cat a bath at least once a week can reduce airborne allergen.

## 2017-05-08 ENCOUNTER — Telehealth: Payer: Self-pay | Admitting: *Deleted

## 2017-05-08 DIAGNOSIS — J301 Allergic rhinitis due to pollen: Secondary | ICD-10-CM | POA: Diagnosis not present

## 2017-05-08 NOTE — Progress Notes (Signed)
VIALS EXP 05-09-18  HC/JM 

## 2017-05-08 NOTE — Telephone Encounter (Signed)
Called patient and left VM on cell phone.  Need to know if patent is doing well on medication and wants to continue just medication alone or wants to start allergy injections.  Patient is on the schedule to start injections 05/13/17 [place].  Need to know in order to have vials ready.

## 2017-05-08 NOTE — Telephone Encounter (Signed)
Patient called and confirmed that he does want to start allergy injections.

## 2017-05-08 NOTE — Addendum Note (Signed)
Addended by: Alfonse SpruceGALLAGHER, Shahidah Nesbitt [place]OUIS on: 05/08/2017 01:43 PM   Modules accepted: Orders

## 2017-05-09 DIAGNOSIS — J3089 Other allergic rhinitis: Secondary | ICD-10-CM | POA: Diagnosis not present

## 2017-05-13 ENCOUNTER — Ambulatory Visit: Payer: PRIVATE HEALTH INSURANCE

## 2017-05-20 ENCOUNTER — Ambulatory Visit (INDEPENDENT_AMBULATORY_CARE_PROVIDER_SITE_OTHER): Payer: PRIVATE HEALTH INSURANCE | Admitting: *Deleted

## 2017-05-20 DIAGNOSIS — J309 Allergic rhinitis, unspecified: Secondary | ICD-10-CM

## 2017-05-20 NOTE — Progress Notes (Signed)
Immunotherapy   Patient Details  Name: Adam Drake MRN: [id number] Date of Birth: 02-24-1981  05/20/2017  Adam NightingaleBrenton S Lean : Patient started Allergy Injections.  Grass-Tree-Dog and Molds.  Blue Vial 1:100,000 and 0.05 ml given of each vial. Following schedule: B  Frequency: 1-2 times weekly Epi-Pen: Patient does have Epi-pen and has been instructed on proper use. Consent signed and patient instructions given. Patient waited 60 minutes after injection and no reaction noted.   Adam Drake 05/20/2017, 1:42 PM

## 2017-05-27 ENCOUNTER — Ambulatory Visit (INDEPENDENT_AMBULATORY_CARE_PROVIDER_SITE_OTHER): Payer: PRIVATE HEALTH INSURANCE | Admitting: *Deleted

## 2017-05-27 DIAGNOSIS — J309 Allergic rhinitis, unspecified: Secondary | ICD-10-CM | POA: Diagnosis not present

## 2017-06-17 ENCOUNTER — Ambulatory Visit (INDEPENDENT_AMBULATORY_CARE_PROVIDER_SITE_OTHER): Payer: PRIVATE HEALTH INSURANCE

## 2017-06-17 DIAGNOSIS — J309 Allergic rhinitis, unspecified: Secondary | ICD-10-CM

## 2017-07-29 ENCOUNTER — Ambulatory Visit: Payer: PRIVATE HEALTH INSURANCE | Admitting: Allergy & Immunology

## 2019-10-01 ENCOUNTER — Other Ambulatory Visit: Payer: Self-pay

## 2019-10-01 DIAGNOSIS — Z20822 Contact with and (suspected) exposure to covid-19: Secondary | ICD-10-CM

## 2019-10-03 LAB — NOVEL CORONAVIRUS, NAA: SARS-CoV-2, NAA: NOT DETECTED

## 2020-07-27 ENCOUNTER — Other Ambulatory Visit: Payer: Self-pay

## 2020-09-14 ENCOUNTER — Ambulatory Visit: Payer: Self-pay

## 2023-08-26 ENCOUNTER — Encounter: Payer: Self-pay | Admitting: Cardiovascular Disease

## 2023-08-26 ENCOUNTER — Ambulatory Visit: Payer: 59 | Attending: Cardiovascular Disease | Admitting: Cardiovascular Disease

## 2023-08-26 DIAGNOSIS — R0602 Shortness of breath: Secondary | ICD-10-CM

## 2023-08-26 DIAGNOSIS — R0789 Other chest pain: Secondary | ICD-10-CM | POA: Diagnosis not present

## 2023-08-26 DIAGNOSIS — Z9889 Other specified postprocedural states: Secondary | ICD-10-CM

## 2023-08-26 DIAGNOSIS — F909 Attention-deficit hyperactivity disorder, unspecified type: Secondary | ICD-10-CM

## 2023-08-26 DIAGNOSIS — F5101 Primary insomnia: Secondary | ICD-10-CM

## 2023-08-26 DIAGNOSIS — I479 Paroxysmal tachycardia, unspecified: Secondary | ICD-10-CM

## 2023-08-26 DIAGNOSIS — R0989 Other specified symptoms and signs involving the circulatory and respiratory systems: Secondary | ICD-10-CM

## 2023-08-26 DIAGNOSIS — E782 Mixed hyperlipidemia: Secondary | ICD-10-CM | POA: Diagnosis not present

## 2023-08-26 NOTE — Progress Notes (Signed)
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  Adam Drake denies any known history of CAD.  He has several year history of documented hyperlipidemia and several years ago started statin therapy but only took this intermittently and discontinued treatment.  May 26, 2023, repeat laboratory showed a total cholesterol 248, triglycerides 230, VLDL 44, and LDL cholesterol was 171.  At that time, he was advised to start atorvastatin and currently takes 20 mg daily.  He has a history of ADHD and has been taking Adderall XR 20 mg the past year.  History of depression and has been on bupropion ER 100 mg in addition to Prozac.  Remotely he had been on citalopram which was discontinued.  Times he alternates taking Lunesta 2 mg and other times Ambien 10 mg to aid with sleep initiation.  He does have a history of blood pressure elevation and has been taking atenolol 25 mg as needed.  He has a history of hypertension.  He has experienced episodes of chest pain described as pressure at times that is hard for him to get a deep breath.  His chest pain symptomatology is nonexertional.  He does drink Gatorade or water with benefit.  His wife is an ER notes.  He tells me he had a sleep study 7 to 8 years ago and was told of having sleep apnea.  He tried CPAP for short duration and discontinued therapy.  Times during periods of increased stress at work he notices his heart rate increasing.  He is now referred for cardiology consultation.  Past Medical History:  Diagnosis Date   Kidney stones     Past Surgical  History:  Procedure Laterality Date   LITHOTRIPSY      Current Medications: Outpatient Medications Prior to Visit  Medication Sig Dispense Refill   amphetamine-dextroamphetamine (ADDERALL XR) 20 MG 24 hr capsule Take 20 mg by mouth every morning.     atenolol (TENORMIN) 25 MG tablet Take 25 mg by mouth as needed.     atorvastatin (LIPITOR) 20 MG tablet Take 20 mg by mouth daily.     buPROPion ER (WELLBUTRIN SR) 100 MG 12 hr tablet Take 100 mg by mouth daily.     clindamycin (CLEOCIN) 300 MG capsule Take 300 mg by mouth 3 (three) times daily.     eszopiclone (LUNESTA) 2 MG TABS tablet Take 2 mg by mouth at bedtime as needed for sleep.     FLUoxetine (PROZAC) 40 MG capsule Take 40 mg by mouth daily.     mupirocin ointment (BACTROBAN) 2 % Apply 1 Application topically 2 (two) times daily.     Needle, Disp, (HYPODERMIC NEEDLE 21GX1-1/2") 21G X 1-1/2" MISC by Does not apply route.     omeprazole (PRILOSEC) 20 MG capsule Take 20 mg by mouth daily.     silver sulfADIAZINE (SILVADENE) 1 % cream Apply 1 Application topically daily.     Syringe, Disposable, (2-3CC SYRINGE) 3 ML MISC by Does not apply route.     testosterone cypionate (DEPOTESTOSTERONE CYPIONATE)  200 MG/ML injection Inject into the muscle.     zolpidem (AMBIEN) 10 MG tablet Take 10 mg by mouth at bedtime.     citalopram (CELEXA) 40 MG tablet TAKE 1 TABLET BY MOUTH DAILY (Patient not taking: Reported on 08/26/2023)     fluticasone (FLONASE) 50 MCG/ACT nasal spray Place 2 sprays into both nostrils daily as needed. (Patient not taking: Reported on 08/26/2023)     No facility-administered medications prior to visit.     Allergies:   Hydrocodone, Codeine, and Oxycodone   Social History   Socioeconomic History   Marital status: Single    Spouse name: Not on file   Number of children: Not on file   Years of education: Not on file   Highest education level: Not on file  Occupational History   Not on file  Tobacco Use   Smoking  status: Never   Smokeless tobacco: Never  Substance and Sexual Activity   Alcohol use: Yes    Comment: occasional   Drug use: No   Sexual activity: Not on file  Other Topics Concern   Not on file  Social History Narrative   Not on file   Social Determinants of Health   Financial Resource Strain: Low Risk  (05/25/2023)   Received from [facility], [facility]   Overall Financial Resource Strain (CARDIA)    Difficulty of Paying Living Expenses: Not very hard  Food Insecurity: No Food Insecurity (05/25/2023)   Received from [facility], [facility]   Hunger Vital Sign    Worried About Running Out of Food in the Last Year: Never true    Ran Out of Food in the Last Year: Never true  Transportation Needs: No Transportation Needs (05/25/2023)   Received from [facility], [facility]   PRAPARE - Transportation    Lack of Transportation (Medical): No    Lack of Transportation (Non-Medical): No  Physical Activity: Inactive (05/25/2023)   Received from [facility], [facility]   Exercise Vital Sign    Days of Exercise per Week: 0 days    Minutes of Exercise per Session: 10 min  Stress: Stress Concern Present (05/25/2023)   Received from [facility], [facility] - Occupational Stress Questionnaire    Feeling of Stress : Rather much  Social Connections: Somewhat Isolated (05/25/2023)   Received from [facility], [facility]   Social Network    How would you rate your social network (family, work, friends)?: Restricted participation with some degree of social isolation    Socially, he was born in [place].  He went to [employer] high school.  He lives in [place].  He is married for 1 year.  He has worked in the [employer] for 15 years and currently is a Network engineer.  Family History: Mother died at age 92 secondary to breast cancer.  Father is living at age 47 but had a heart attack at age  57.  He has 2 sisters ages 42 and 71 who are alive and well.  ROS General: Negative; No fevers, chills, or night sweats;  HEENT: Negative; No changes in vision or hearing, sinus congestion, difficulty swallowing Pulmonary: Negative; No cough, wheezing, shortness of breath, hemoptysis Cardiovascular: See HPI GI: Negative; No nausea, vomiting, diarrhea, or abdominal pain GU: h/o lithotripsy for kidney stone Musculoskeletal: Negative; no myalgias, joint pain, or weakness Hematologic/Oncology: Negative; no easy bruising, bleeding Endocrine: Negative; no heat/cold intolerance; no diabetes Neuro:  ADHD,  no changes in balance, headaches Skin: Recent cellulitis of right lower extremity Psychiatric:  depression Sleep: No history of OSA, tried CPAP for very short duration approximately 6 to 7 years ago and had difficulties with the mask and stopped treatment.  Other comprehensive 14 point system review is negative.   PHYSICAL EXAM:   VS:  BP 132/84   Pulse 69   Ht 5\' 8"  (1.727 m)   Wt 193 lb 3.2 oz (87.6 kg)   SpO2 96%   BMI 29.38 kg/m     Repeat blood pressure by me was 128/84.  Wt Readings from Last 3 Encounters:  08/26/23 193 lb 3.2 oz (87.6 kg)  04/29/17 202 lb 6.4 oz (91.8 kg)  12/08/13 165 lb (74.8 kg)    General: Alert, oriented, no distress.  Skin: normal turgor, no rashes, warm and dry HEENT: Normocephalic, atraumatic. Pupils equal round and reactive to light; sclera anicteric; extraocular muscles intact; Nose without nasal septal hypertrophy Mouth/Parynx benign; Mallinpatti scale 3 Neck: No JVD, no carotid bruits; normal carotid upstroke Lungs: clear to ausculatation and percussion; no wheezing or rales Chest wall: without tenderness to palpitation Heart: PMI not displaced, RRR, s1 s2 normal, 1/6 systolic murmur, no diastolic murmur, no rubs, gallops, thrills, or heaves Abdomen: soft, nontender; no hepatosplenomehaly, BS+; abdominal aorta nontender and not dilated by  palpation. Back: no CVA tenderness Pulses 2+ Musculoskeletal: full range of motion, normal strength, no joint deformities Extremities: no clubbing cyanosis or edema, Homan's sign negative  Neurologic: grossly nonfocal; Cranial nerves grossly wnl Psychologic: Normal mood and affect   Studies/Labs Reviewed:   EKG Interpretation Date/Time:  Tuesday August 26 2023 10:02:38 EDT Ventricular Rate:  69 PR Interval:  134 QRS Duration:  92 QT Interval:  386 QTC Calculation: 413 R Axis:   7  Text Interpretation: Normal sinus rhythm Normal ECG When compared with ECG of 16-Jun-2008 08:58, Nonspecific T wave abnormality now evident in Lateral leads Confirmed by Nicki Guadalajara ([id number]) on 08/26/2023 10:27:04 AM    Recent Labs:    Latest Ref Rng & Units 08/23/2010   10:38 AM 06/16/2008    8:45 AM  BMP  Glucose 70 - 99 mg/dL 90  347   BUN 6 - 23 mg/dL 10  9   Creatinine 0.4 - 1.5 mg/dL 1.0  4.25   Sodium 956 - 145 meq/L 139  143   Potassium 3.5 - 5.1 meq/L 5.0  4.0   Chloride 96 - 112 meq/L 103  109   CO2 19 - 32 meq/L 29  25   Calcium 8.4 - 10.5 mg/dL 38.7  9.7         Latest Ref Rng & Units 08/23/2010   10:38 AM  Hepatic Function  Total Protein 6.0 - 8.3 g/dL 7.1   Albumin 3.5 - 5.2 g/dL 4.5   AST 0 - 37 units/L 28   ALT 0 - 53 units/L 66   Alk Phosphatase 39 - 117 units/L 63   Total Bilirubin 0.3 - 1.2 mg/dL 0.9   Bilirubin, Direct 0.0 - 0.3 mg/dL 0.1        Latest Ref Rng & Units 08/23/2010   10:38 AM 06/16/2008    8:45 AM  CBC  WBC 4.5 - 10.5 10*3/microliter 6.7  8.2   Hemoglobin 13.0 - 17.0 g/dL 56.4  33.2   Hematocrit 39.0 - 52.0 % 48.5  47.1   Platelets 150.0 - 400.0 K/uL 309.0  303    Lab Results  Component  Value Date   MCV 88.6 08/23/2010   MCV 88.6 06/16/2008   Lab Results  Component Value Date   TSH 1.75 08/23/2010   No results found for: "HGBA1C"   BNP No results found for: "BNP"  ProBNP No results found for: "PROBNP"   Lipid Panel     Component  Value Date/Time   CHOL 211 (H) 08/23/2010 1038   TRIG 190.0 (H) 08/23/2010 1038   HDL 38.50 (L) 08/23/2010 1038   CHOLHDL 5 08/23/2010 1038   VLDL 38.0 08/23/2010 1038   LDLDIRECT 140.1 08/23/2010 1038     RADIOLOGY: No results found.   Additional studies/ records that were reviewed today include:  Reviewed the records from [facility] at [facility] family [facility]  ASSESSMENT:    1. Labile hypertension   2. Chest pressure   3. SOB (shortness of breath)   4. Mixed hyperlipidemia   5. Paroxysmal tachycardia (HCC)   6. Attention deficit hyperactivity disorder (ADHD), unspecified ADHD type   7. 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  Has a history of depression and currently is on bupropion in addition to fluoxetine.  He is on atenolol he takes 25 mg as needed for increased heart rate greater than 100.  Often times he seems to take this every 2 to 3 days.  Has a remote history of kidney stone and is status post lithotripsy.  Recent laboratory has shown significant elevation of lipids total cholesterol 248, triglycerides 230, VLDL 44 and LDL 171 from May 26, 2023.  At that time, he was started on atorvastatin 20 mg.  He has not had subsequent laboratory.  He has experienced episodic chest pain which he describes as a pressure where it is hard for him to get a deep breath.  His chest pain is nonexertional and seems to improve when he drinks water or Gatorade.  He specifically denies any exertional precipitation to the discomfort.  As only, I have recommended he undergo follow-up fasting laboratory and will check a comprehensive metabolic panel, CBC, TSH, fasting lipid studies and I will also check an LP(a).  His father had a heart attack at age 78.  I discussed with him  potential increased risk for vulnerable plaque if LP(a) is elevated and the importance of even more aggressive lipid-lowering therapy.  With his blood pressure lability and symptomatology I am also scheduling him to undergo a 2D echo Doppler study.  With his significant elevation of lipids, I have recommended we obtain a coronary calcium score.  If coronary calcification is present, at aggressive lipid management will be necessary.  His chest pain is nonexertional.  At this time I do not feel he needs nuclear or pet imaging but have recommended he undergo a routine treadmill test to assess for size induced heart rate changes and potential initial screening for ischemic CAD.  I also discussed sleep apnea in detail.  I reviewed potential adverse cardiovascular consequences if sleep apnea is significant and untreated with particular effects on blood pressure control, increased heart rate, risk for atrial fibrillation, effects on insulin resistance, inflammatory markers, nocturnal GERD, and potential for nocturnal hypoxemia contributing to ischemia if underlying cardiac or cerebrovascular disease is present. We discussed potential reevaluation for sleep apnea depending upon symptomatology in the future and the significant  improvement in current mask compared to 8 years ago.  Will see him in follow-up of the above studies and further recommendations will be made at that time.   Medication Adjustments/Labs and Tests Ordered: Current medicines are reviewed at length with the patient today.  Concerns regarding medicines are outlined above.  Medication changes, Labs and Tests ordered today are listed in the Patient Instructions below. Patient Instructions  Medication Instructions:  No medication changes *If you need a refill on your cardiac medications before your next appointment, please call your pharmacy*   Lab Work: Lipid panel, CME, CBC, TSH, LPa If you have labs (blood work) drawn today and your tests are  completely normal, you will receive your results only by: MyChart Message (if you have MyChart) OR A paper copy in the mail If you have any lab test that is abnormal or we need to change your treatment, we will call you to review the results.  Testing/Procedures:  Your physician has requested that you have an echocardiogram. Echocardiography is a painless test that uses sound waves to create images of your heart. It provides your doctor with information about the size and shape of your heart and how well your heart's chambers and valves are working. This procedure takes approximately one hour. There are no restrictions for this procedure. Please do NOT wear cologne, perfume, aftershave, or lotions (deodorant is allowed).   Please arrive 15 minutes prior to your appointment time.      -CALCIUM SCORE   Your physician has requested that you have en exercise stress myoview. For further information please visit [email or web address] Please follow instruction sheet, as given.    Follow-Up: At [facility], you and your health needs are our priority.  As part of our continuing mission to provide you with exceptional heart care, we have created designated Provider Care Teams.  These Care Teams include your primary Cardiologist (physician) and Advanced Practice Providers (APPs -  Physician Assistants and Nurse Practitioners) who all work together to provide you with the care you need, when you need it.  We recommend signing up for the patient portal called "MyChart".  Sign up information is provided on this After Visit Summary.  MyChart is used to connect with patients for Virtual Visits (Telemedicine).  Patients are able to view lab/test results, encounter notes, upcoming appointments, etc.  Non-urgent messages can be sent to your provider as well.   To learn more about what you can do with MyChart, go to [email or web address].    Your next appointment:   2 month(s)  Provider:    Nicki Guadalajara, MD       Signed, Nicki Guadalajara, MD  08/26/2023 6:03 PM    [facility] [address], [place], [place]  [address] Phone: [phone]

## 2023-08-26 NOTE — Patient Instructions (Addendum)
Medication Instructions:  No medication changes *If you need a refill on your cardiac medications before your next appointment, please call your pharmacy*   Lab Work: Lipid panel, CME, CBC, TSH, LPa If you have labs (blood work) drawn today and your tests are completely normal, you will receive your results only by: MyChart Message (if you have MyChart) OR A paper copy in the mail If you have any lab test that is abnormal or we need to change your treatment, we will call you to review the results.  Testing/Procedures:  Your physician has requested that you have an echocardiogram. Echocardiography is a painless test that uses sound waves to create images of your heart. It provides your doctor with information about the size and shape of your heart and how well your heart's chambers and valves are working. This procedure takes approximately one hour. There are no restrictions for this procedure. Please do NOT wear cologne, perfume, aftershave, or lotions (deodorant is allowed).   Please arrive 15 minutes prior to your appointment time.      -CALCIUM SCORE   Your physician has requested that you have en exercise stress myoview. For further information please visit [email or web address] Please follow instruction sheet, as given.    Follow-Up: At [facility], you and your health needs are our priority.  As part of our continuing mission to provide you with exceptional heart care, we have created designated Provider Care Teams.  These Care Teams include your primary Cardiologist (physician) and Advanced Practice Providers (APPs -  Physician Assistants and Nurse Practitioners) who all work together to provide you with the care you need, when you need it.  We recommend signing up for the patient portal called "MyChart".  Sign up information is provided on this After Visit Summary.  MyChart is used to connect with patients for Virtual Visits (Telemedicine).  Patients are able to view  lab/test results, encounter notes, upcoming appointments, etc.  Non-urgent messages can be sent to your provider as well.   To learn more about what you can do with MyChart, go to [email or web address].    Your next appointment:   2 month(s)  Provider:   Nicki Guadalajara, MD

## 2023-08-27 LAB — COMPREHENSIVE METABOLIC PANEL
ALT: 91 IU/L — ABNORMAL HIGH (ref 0–44)
AST: 37 IU/L (ref 0–40)
Albumin: 4.8 g/dL (ref 4.1–5.1)
Alkaline Phosphatase: 69 IU/L (ref 44–121)
BUN/Creatinine Ratio: 8 — ABNORMAL LOW (ref 9–20)
BUN: 9 mg/dL (ref 6–24)
Bilirubin Total: 0.5 mg/dL (ref 0.0–1.2)
CO2: 22 mmol/L (ref 20–29)
Calcium: 10 mg/dL (ref 8.7–10.2)
Chloride: 104 mmol/L (ref 96–106)
Creatinine, Ser: 1.14 mg/dL (ref 0.76–1.27)
Globulin, Total: 2.4 g/dL (ref 1.5–4.5)
Glucose: 81 mg/dL (ref 70–99)
Potassium: 5.2 mmol/L (ref 3.5–5.2)
Sodium: 141 mmol/L (ref 134–144)
Total Protein: 7.2 g/dL (ref 6.0–8.5)
eGFR: 83 mL/min/{1.73_m2} (ref >59)

## 2023-08-27 LAB — LIPID PANEL
Cholesterol, Total: 158 mg/dL (ref 100–199)
HDL: 42 mg/dL (ref >39)
LDL CALC COMMENT:: 3.8 ratio (ref 0.0–5.0)
LDL Chol Calc (NIH): 90 mg/dL (ref 0–99)
Triglycerides: 145 mg/dL (ref 0–149)
VLDL Cholesterol Cal: 26 mg/dL (ref 5–40)

## 2023-08-27 LAB — CBC
Hematocrit: 52.6 % — ABNORMAL HIGH (ref 37.5–51.0)
Hemoglobin: 17.1 g/dL (ref 13.0–17.7)
MCH: 29.1 pg (ref 26.6–33.0)
MCHC: 32.5 g/dL (ref 31.5–35.7)
MCV: 90 fL (ref 79–97)
Platelets: 344 10*3/uL (ref 150–450)
RBC: 5.88 x10E6/uL — ABNORMAL HIGH (ref 4.14–5.80)
RDW: 12.5 % (ref 11.6–15.4)
WBC: 8.4 10*3/uL (ref 3.4–10.8)

## 2023-08-27 LAB — LIPOPROTEIN A (LPA): Lipoprotein (a): 27.7 nmol/L (ref <75.0)

## 2023-08-27 LAB — TSH: TSH: 1.86 u[IU]/mL (ref 0.450–4.500)

## 2023-09-09 ENCOUNTER — Telehealth (HOSPITAL_COMMUNITY): Payer: Self-pay | Admitting: *Deleted

## 2023-09-09 NOTE — Telephone Encounter (Signed)
Left detailed instructions for ETT.

## 2023-09-10 ENCOUNTER — Telehealth: Payer: Self-pay | Admitting: Cardiovascular Disease

## 2023-09-10 ENCOUNTER — Telehealth (HOSPITAL_COMMUNITY): Payer: Self-pay | Admitting: *Deleted

## 2023-09-10 NOTE — Telephone Encounter (Signed)
Patient is calling stating the VM with his ETT instructions cut off so he did not get all of them.   He is requesting a callback to go over the remaining instructions.   Please advise.

## 2023-09-10 NOTE — Telephone Encounter (Signed)
Pt called to go over instructions for ETT. Pt was called and given instructions and pt stated he would be at appt.

## 2023-09-11 ENCOUNTER — Ambulatory Visit (HOSPITAL_COMMUNITY): Payer: 59 | Attending: Cardiovascular Disease

## 2023-09-11 DIAGNOSIS — I479 Paroxysmal tachycardia, unspecified: Secondary | ICD-10-CM | POA: Insufficient documentation

## 2023-09-11 DIAGNOSIS — R0602 Shortness of breath: Secondary | ICD-10-CM | POA: Insufficient documentation

## 2023-09-11 LAB — EXERCISE TOLERANCE TEST
Angina Index: 0
Duke Treadmill Score: 10
Estimated workload: 11.7
Exercise duration (min): 10 min
Exercise duration (sec): 0 s
MPHR: 180 {beats}/min
Peak HR: 179 {beats}/min
Percent HR: 99 %
Rest HR: 104 {beats}/min
ST Depression (mm): 0 mm

## 2023-09-16 ENCOUNTER — Ambulatory Visit (INDEPENDENT_AMBULATORY_CARE_PROVIDER_SITE_OTHER): Payer: 59

## 2023-09-16 ENCOUNTER — Ambulatory Visit (HOSPITAL_BASED_OUTPATIENT_CLINIC_OR_DEPARTMENT_OTHER)
Admission: RE | Admit: 2023-09-16 | Discharge: 2023-09-16 | Disposition: A | Discharge status: Home / Self Care | Payer: 59 | Source: Ambulatory Visit | Attending: Cardiovascular Disease | Admitting: Cardiovascular Disease

## 2023-09-16 DIAGNOSIS — R0602 Shortness of breath: Secondary | ICD-10-CM

## 2023-09-16 DIAGNOSIS — I479 Paroxysmal tachycardia, unspecified: Secondary | ICD-10-CM

## 2023-09-16 DIAGNOSIS — E782 Mixed hyperlipidemia: Secondary | ICD-10-CM | POA: Insufficient documentation

## 2023-09-16 LAB — ECHOCARDIOGRAM COMPLETE
Area-P 1/2: 4.29 cm2
S' Lateral: 3.69 cm

## 2023-10-23 ENCOUNTER — Encounter: Payer: Self-pay | Admitting: Cardiovascular Disease

## 2023-10-23 ENCOUNTER — Ambulatory Visit: Payer: 59 | Attending: Cardiovascular Disease | Admitting: Cardiovascular Disease

## 2023-10-23 VITALS — BP 126/80 | HR 92 | Ht 68.0 in | Wt 192.6 lb

## 2023-10-23 DIAGNOSIS — I479 Paroxysmal tachycardia, unspecified: Secondary | ICD-10-CM

## 2023-10-23 DIAGNOSIS — R0789 Other chest pain: Secondary | ICD-10-CM

## 2023-10-23 DIAGNOSIS — R931 Abnormal findings on diagnostic imaging of heart and coronary circulation: Secondary | ICD-10-CM

## 2023-10-23 DIAGNOSIS — F909 Attention-deficit hyperactivity disorder, unspecified type: Secondary | ICD-10-CM

## 2023-10-23 DIAGNOSIS — R7989 Other specified abnormal findings of blood chemistry: Secondary | ICD-10-CM

## 2023-10-23 DIAGNOSIS — E782 Mixed hyperlipidemia: Secondary | ICD-10-CM | POA: Diagnosis not present

## 2023-10-23 DIAGNOSIS — Z8669 Personal history of other diseases of the nervous system and sense organs: Secondary | ICD-10-CM

## 2023-10-23 MED ORDER — EZETIMIBE 10 MG PO TABS: 10.0000 mg | ORAL_TABLET | Freq: Every day | ORAL | 3 refills | Status: AC

## 2023-10-23 NOTE — Patient Instructions (Addendum)
Medication Instructions:  Begin Zetia 10mg . Take one tablet daily.  Decrease the Atorvastatin from 20mg  to 10mg s daily/   *If you need a refill on your cardiac medications before your next appointment, please call your pharmacy*   Lab Work: CMET, LIPID If you have labs (blood work) drawn today and your tests are completely normal, you will receive your results only by: MyChart Message (if you have MyChart) OR A paper copy in the mail If you have any lab test that is abnormal or we need to change your treatment, we will call you to review the results.    Follow-Up: At [facility], you and your health needs are our priority.  As part of our continuing mission to provide you with exceptional heart care, we have created designated Provider Care Teams.  These Care Teams include your primary Cardiologist (physician) and Advanced Practice Providers (APPs -  Physician Assistants and Nurse Practitioners) who all work together to provide you with the care you need, when you need it.  We recommend signing up for the patient portal called "MyChart".  Sign up information is provided on this After Visit Summary.  MyChart is used to connect with patients for Virtual Visits (Telemedicine).  Patients are able to view lab/test results, encounter notes, upcoming appointments, etc.  Non-urgent messages can be sent to your provider as well.   To learn more about what you can do with MyChart, go to [email or web address].    Your next appointment:   6 month(s)  Provider:  Nicki Guadalajara MD

## 2023-10-23 NOTE — Progress Notes (Signed)
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  Adam Drake denies any known history of CAD.  He has several year history of documented hyperlipidemia and several years ago started statin therapy but only took this intermittently and discontinued treatment.  May 26, 2023, repeat laboratory showed a total cholesterol 248, triglycerides 230, VLDL 44, and LDL cholesterol was 171.  At that time, he was advised to start atorvastatin and currently takes 20 mg daily.  He has a history of ADHD and has been taking Adderall XR 20 mg the past year.  History of depression and has been on bupropion ER 100 mg in addition to Prozac.  Remotely he had been on citalopram which was discontinued.  Times he alternates taking Lunesta 2 mg and other times Ambien 10 mg to aid with sleep initiation.  He does have a history of blood pressure elevation and has been taking atenolol 25 mg as needed.  He has a history of hypertension.  He has experienced episodes of chest pain described as pressure at times that is hard for him to get a deep breath.  His chest pain symptomatology is nonexertional.  He does drink Gatorade or water with benefit.  His wife is an ER notes.  He tells me he had a sleep study 7 to 8 years ago and was told of having sleep apnea.  He tried CPAP for short duration and discontinued therapy.  At times during periods of increased stress at work he notices his heart rate increasing.    During his initial evaluation, chest pain was nonexertional and seem to improve with drinking water or Gatorade.  I  recommended he undergo complete set of laboratory including a comprehensive metabolic panel, CBC, TSH, fasting lipid studies as well as LP(a).  Recommended he undergo 2D echo Doppler evaluation.  With his elevated lipids I recommended he obtain a coronary calcium score.  Also recommended he undergo a routine treadmill test test for initial screening for ischemic CAD.  We also discussed the potential for repeat sleep evaluation depend upon symptomatology but at present he plan to defer this.  Underwent a 2D echo Doppler study on September 16, 2023.  This showed mildly reduced LV function with EF 45 to 50% without wall motion abnormalities.  Global strain was abnormal.  Chamber dimensions were normal.  There was very mild aortic valve sclerosis without stenosis.  A calcium score was performed on September 16, 2023 which showed calcium score at 4.58 with mild plaque in the LAD.  He of his chest over read did not show any acute or unexpected extracardiac findings.  Underwent a routine treadmill test which was normal with excellent exercise tolerance and no ST segment changes.  He completed stage IV the Bruce protocol and achieved a peak heart rate at 179 bpm representing 99 percent of maximal heart rate.  Laboratory from his evaluation showed total cholesterol 158, HDL 42.  Triglycerides are 145.  His LDL had improved from 171 when checked in July by his primary  physician down to 90.  LP(a) was normal at 27.7.  AST was normal at 37 but ALT was increased at 91.  Presently he is here with his wife.  He feels well and denies any recurrent chest pain or shortness of breath.  His wife states that he does snore.  Apparently his primary physician reportedly has sent a copy of his old sleep study to our office.  However we have not been able to identify this as of yet.  He presents for follow-up evaluation.   Past Medical History:  Diagnosis Date   Kidney stones     Past Surgical History:  Procedure Laterality Date    LITHOTRIPSY      Current Medications: Outpatient Medications Prior to Visit  Medication Sig Dispense Refill   amphetamine-dextroamphetamine (ADDERALL XR) 20 MG 24 hr capsule Take 20 mg by mouth every morning.     atenolol (TENORMIN) 25 MG tablet Take 25 mg by mouth as needed.     atorvastatin (LIPITOR) 20 MG tablet Take 20 mg by mouth daily.     buPROPion ER (WELLBUTRIN SR) 100 MG 12 hr tablet Take 100 mg by mouth daily.     citalopram (CELEXA) 40 MG tablet      eszopiclone (LUNESTA) 2 MG TABS tablet Take 2 mg by mouth at bedtime as needed for sleep.     FLUoxetine (PROZAC) 40 MG capsule Take 40 mg by mouth daily.     fluticasone (FLONASE) 50 MCG/ACT nasal spray Place 2 sprays into both nostrils daily as needed.     mupirocin ointment (BACTROBAN) 2 % Apply 1 Application topically 2 (two) times daily.     Needle, Disp, (HYPODERMIC NEEDLE 21GX1-1/2") 21G X 1-1/2" MISC by Does not apply route.     omeprazole (PRILOSEC) 20 MG capsule Take 20 mg by mouth daily.     silver sulfADIAZINE (SILVADENE) 1 % cream Apply 1 Application topically daily.     Syringe, Disposable, (2-3CC SYRINGE) 3 ML MISC by Does not apply route.     testosterone cypionate (DEPOTESTOSTERONE CYPIONATE) 200 MG/ML injection Inject into the muscle.     zolpidem (AMBIEN) 10 MG tablet Take 10 mg by mouth at bedtime.     No facility-administered medications prior to visit.     Allergies:   Hydrocodone, Codeine, and Oxycodone   Social History   Socioeconomic History   Marital status: Single    Spouse name: Not on file   Number of children: Not on file   Years of education: Not on file   Highest education level: Not on file  Occupational History   Not on file  Tobacco Use   Smoking status: Never   Smokeless tobacco: Never  Substance and Sexual Activity   Alcohol use: Yes    Comment: occasional   Drug use: No   Sexual activity: Not on file  Other Topics Concern   Not on file  Social History Narrative   Not on  file   Social Determinants of Health   Financial Resource Strain: Low Risk  (05/25/2023)   Received from [facility], [facility]   Overall Financial Resource Strain (CARDIA)    Difficulty of Paying Living Expenses: Not very hard  Food Insecurity: No Food Insecurity (05/25/2023)   Received from [facility], [facility]   Hunger Vital Sign    Worried About Running Out of Food in the Last Year: Never true    Ran Out of Food in the Last Year: Never true  Transportation Needs: No Transportation Needs (05/25/2023)   Received from [facility], [facility]   [facility] - Transportation    Lack of Transportation (Medical): No    Lack of Transportation (Non-Medical): No  Physical Activity: Inactive (05/25/2023)   Received from [facility], [facility]   Exercise Vital Sign    Days of Exercise per Week: 0 days    Minutes of Exercise per Session: 10 min  Stress: Stress Concern Present (05/25/2023)   Received from [facility], [facility] - Occupational Stress Questionnaire    Feeling of Stress : Rather much  Social Connections: Somewhat Isolated (05/25/2023)   Received from [facility], [facility]   Social Network    How would you rate your social network (family, work, friends)?: Restricted participation with some degree of social isolation    Socially, he was born in [place].  He went to [employer] high school.  He lives in [place].  He is married for 1 year.  He has worked in the [employer] for 15 years and currently is a Network engineer.  Family History: Mother died at age 48 secondary to breast cancer.  Father is living at age 88 but had a heart attack at age 76.  He has 2 sisters ages 18 and 56 who are alive and well.  ROS General: Negative; No fevers, chills, or night sweats;  HEENT: Negative; No changes in vision or hearing, sinus congestion, difficulty swallowing Pulmonary: Negative; No  cough, wheezing, shortness of breath, hemoptysis Cardiovascular: See HPI GI: Negative; No nausea, vomiting, diarrhea, or abdominal pain GU: h/o lithotripsy for kidney stone Musculoskeletal: Negative; no myalgias, joint pain, or weakness Hematologic/Oncology: Negative; no easy bruising, bleeding Endocrine: Negative; no heat/cold intolerance; no diabetes Neuro: ADHD,  no changes in balance, headaches Skin: Recent cellulitis of right lower extremity Psychiatric:  depression Sleep: No history of OSA, tried CPAP for very short duration approximately 6 to 7 years ago and had difficulties with the mask and stopped treatment.  Other comprehensive 14 point system review is negative.   PHYSICAL EXAM:   VS:  BP 126/80   Pulse 92   Ht 5\' 8"  (1.727 m)   Wt 192 lb 9.6 oz (87.4 kg)   SpO2 97%   BMI 29.28 kg/m     Repeat blood pressure by me was 126/80  Wt Readings from Last 3 Encounters:  10/23/23 192 lb 9.6 oz (87.4 kg)  08/26/23 193 lb 3.2 oz (87.6 kg)  04/29/17 202 lb 6.4 oz (91.8 kg)    General: Alert, oriented, no distress.  Skin: normal turgor, no rashes, warm and dry HEENT: Normocephalic, atraumatic. Pupils equal round and reactive to light; sclera anicteric; extraocular muscles intact;  Nose without nasal septal hypertrophy Mouth/Parynx benign; Mallinpatti scale Neck: No JVD, no carotid bruits; normal carotid upstroke Lungs: clear to ausculatation and percussion; no wheezing or rales Chest wall: without tenderness to palpitation Heart: PMI not displaced, RRR, s1 s2 normal, 1/6 systolic murmur, no diastolic murmur, no rubs, gallops, thrills, or heaves Abdomen: soft, nontender; no hepatosplenomehaly, BS+; abdominal aorta nontender and not dilated by palpation. Back: no CVA tenderness Pulses 2+ Musculoskeletal: full range of motion, normal strength, no joint deformities Extremities: no clubbing cyanosis or edema, Homan's sign negative  Neurologic: grossly nonfocal; Cranial nerves  grossly wnl Psychologic: Normal mood and affect    Studies/Labs Reviewed:   EKG Interpretation Date/Time:  Thursday October 23 2023 12:39:06 EST Ventricular Rate:  92 PR Interval:  132 QRS Duration:  94 QT Interval:  358 QTC Calculation: 442 R Axis:   45  Text Interpretation: Normal sinus rhythm Minimal voltage criteria for LVH, may be normal variant ( R in aVL ) Inferior infarct , age undetermined When compared with ECG of 26-Aug-2023 10:02, Inferior infarct is now Present Confirmed by Nicki Guadalajara ([id number]) on 10/23/2023 1:24:04 PM    August 26, 2023 ECG (independently read by me): Normal sinus rhythm 72 bpm  Recent Labs:    Latest Ref Rng & Units 08/26/2023   11:21 AM 08/23/2010   10:38 AM 06/16/2008    8:45 AM  BMP  Glucose 70 - 99 mg/dL 81  90  846   BUN 6 - 24 mg/dL 9  10  9    Creatinine 0.76 - 1.27 mg/dL 9.62  1.0  9.52   BUN/Creat Ratio 9 - 20 8     Sodium 134 - 144 mmol/L 141  139  143   Potassium 3.5 - 5.2 mmol/L 5.2  5.0  4.0   Chloride 96 - 106 mmol/L 104  103  109   CO2 20 - 29 mmol/L 22  29  25    Calcium 8.7 - 10.2 mg/dL 84.1  32.4  9.7         Latest Ref Rng & Units 08/26/2023   11:21 AM 08/23/2010   10:38 AM  Hepatic Function  Total Protein 6.0 - 8.5 g/dL 7.2  7.1   Albumin 4.1 - 5.1 g/dL 4.8  4.5   AST 0 - 40 IU/L 37  28   ALT 0 - 44 IU/L 91  66   Alk Phosphatase 44 - 121 IU/L 69  63   Total Bilirubin 0.0 - 1.2 mg/dL 0.5  0.9   Bilirubin, Direct 0.0 - 0.3 mg/dL  0.1        Latest Ref Rng & Units 08/26/2023   11:21 AM 08/23/2010   10:38 AM 06/16/2008    8:45 AM  CBC  WBC 3.4 - 10.8 x10E3/uL 8.4  6.7  8.2   Hemoglobin 13.0 - 17.7 g/dL 40.1  02.7  25.3   Hematocrit 37.5 - 51.0 % 52.6  48.5  47.1   Platelets 150 - 450 x10E3/uL 344  309.0  303    Lab Results  Component Value Date   MCV 90 08/26/2023   MCV 88.6 08/23/2010   MCV 88.6 06/16/2008   Lab Results  Component Value Date   TSH 1.860 08/26/2023   No results found for:  "HGBA1C"   BNP No results found for: "BNP"  ProBNP No results found for: "PROBNP"   Lipid Panel     Component Value Date/Time   CHOL 158 08/26/2023 1121   TRIG 145 08/26/2023 1121   HDL 42 08/26/2023 1121   CHOLHDL 3.8 08/26/2023 1121   CHOLHDL 5 08/23/2010 1038   VLDL 38.0 08/23/2010 1038   LDLCALC 90 08/26/2023 1121   LDLDIRECT 140.1 08/23/2010 1038   LABVLDL 26 08/26/2023 1121     RADIOLOGY: No results found.   Additional studies/ records that were reviewed today include:  Reviewed the records from [place] [facility] at [facility]  ASSESSMENT:    1. Atypical chest pain   2. Mixed hyperlipidemia   3. Paroxysmal tachycardia (HCC)   4. Agatston coronary artery calcium score less than 100: 4.58 in LAD   5. Elevated LFTs   6. Attention deficit hyperactivity disorder (ADHD), unspecified ADHD type   7. 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  Has a history of depression and currently is on bupropion in addition to fluoxetine.  He has a prescription for atenolol and takes 25 mg as needed for increased heart rate greater than 100.  Often times he seems to take this every 2 to 3 days.  He has a remote history of kidney stone and is status post lithotripsy.  Recent laboratory has shown significant elevation of lipids total cholesterol 248, triglycerides 230, VLDL 44 and LDL 171 from May 26, 2023.  At that time, he was started on atorvastatin 20 mg.  He has not had subsequent laboratory.  He has experienced episodic chest pain which he describes as a pressure where it is hard for him to get a deep breath.  His chest pain is nonexertional and seems to improve when he drinks water or Gatorade.  He specifically denies any exertional precipitation to the  discomfort.  As only, I have recommended he undergo follow-up fasting laboratory and will check a comprehensive metabolic panel, CBC, TSH, fasting lipid studies and I will also check an LP(a).  His father had a heart attack at age 28.  I discussed with him potential increased risk for vulnerable plaque if LP(a) is elevated and the importance of even more aggressive lipid-lowering therapy.  He underwent a 2D echo Doppler study on September 16, 2023 which shows mildly reduced LV function with EF at 45 to 50% without wall motion abnormalities.  He had mildly abnormal global strain.  There was mild aortic sclerosis without stenosis.  Liquid lipid-lowering therapy showed significant improvement in his LDL cholesterol from 171 down to 90.  Coronary calcium score was done which showed mild calcification at 4.58 in the LAD.  Chest CT over read was essentially negative.  Presently, he feels well.  He is not having any further chest pain.  If he overexerts himself he does get some very mild shortness of breath.  He underwent a routine treadmill test which showed good exercise tolerance and he was able to complete stage IV the Bruce protocol and achieved a peak heart rate of 179 representing 99% predicted maximum.  There was no chest pain or ECG abnormalities.  Most recent laboratory shows normal AST at 37, but ALT is increased at 91.  I am recommending tomorrow in the fasting state he has a repeat chemistry profile as well as repeat lipid panel.  I have recommended he for now decrease atenolol to 10 mg and will start him on Zetia 10 mg daily.  If LFTs continue to be elevated at atorvastatin will be discontinued.  He is here with his wife today.  She admits that he does snore.  Apparently his primary physician was to send a copy of his old sleep study which we have not yet identified.  We discussed potential repeat sleep evaluation.  In the past he did not tolerate CPAP.  He may be a candidate for customized oral appliance.  See  him back in 4 to 5 months for reevaluation or sooner as needed.   Medication Adjustments/Labs and Tests Ordered: Current medicines are reviewed at length with the [facility] today.  Concerns regarding medicines are outlined above.  Medication changes, Labs and Tests ordered today are listed in the [facility]  Instructions below. [facility] Instructions  Medication Instructions:  Begin Zetia 10mg . Take one tablet daily.  Decrease the Atorvastatin from 20mg  to 10mg s daily/   *If you need a refill on your cardiac medications before your next appointment, please call your pharmacy*   Lab Work: CMET, LIPID If you have labs (blood work) drawn today and your tests are completely normal, you will receive your results only by: MyChart Message (if you have MyChart) OR A paper copy in the mail If you have any lab test that is abnormal or we need to change your treatment, we will call you to review the results.    Follow-Up: At [facility], you and your health needs are our priority.  As part of our continuing mission to provide you with exceptional heart care, we have created designated Provider Care Teams.  These Care Teams include your primary Cardiologist (physician) and Advanced Practice Providers (APPs -  Physician Assistants and Nurse Practitioners) who all work together to provide you with the care you need, when you need it.  We recommend signing up for the [facility] portal called "MyChart".  Sign up information is provided on this After Visit Summary.  MyChart is used to connect with patients for Virtual Visits (Telemedicine).  Patients are able to view lab/test results, encounter notes, upcoming appointments, etc.  Non-urgent messages can be sent to your provider as well.   To learn more about what you can do with MyChart, go to [email or web address].    Your next appointment:   6 month(s)  Provider:  Nicki Guadalajara MD      Signed, Nicki Guadalajara, MD  10/23/2023 2:12 PM    [facility] [address], [address], [place], [place]  [address] Phone: [phone]
